# Patient Record
Sex: Female | Born: 1975 | Race: White | Hispanic: No | Marital: Married | Smoking: Never smoker
Health system: Southern US, Community
[De-identification: ages and names within clinical notes are randomized; demographics above are authoritative.]

## PROBLEM LIST (undated history)

## (undated) DIAGNOSIS — K219 Gastro-esophageal reflux disease without esophagitis: Secondary | ICD-10-CM

## (undated) DIAGNOSIS — N856 Intrauterine synechiae: Secondary | ICD-10-CM

## (undated) DIAGNOSIS — L309 Dermatitis, unspecified: Principal | ICD-10-CM

## (undated) DIAGNOSIS — E039 Hypothyroidism, unspecified: Secondary | ICD-10-CM

## (undated) HISTORY — DX: Dermatitis, unspecified: L30.9

## (undated) HISTORY — PX: DILATION AND EVACUATION: SHX1459

---

## 1999-06-09 ENCOUNTER — Encounter: Admission: RE | Admit: 1999-06-09 | Discharge: 1999-06-09 | Payer: Self-pay | Admitting: Sports Medicine

## 1999-06-10 ENCOUNTER — Encounter: Admission: RE | Admit: 1999-06-10 | Discharge: 1999-06-10 | Payer: Self-pay | Admitting: Family Medicine

## 1999-07-22 ENCOUNTER — Encounter: Admission: RE | Admit: 1999-07-22 | Discharge: 1999-07-22 | Payer: Self-pay | Admitting: Sports Medicine

## 2000-12-20 ENCOUNTER — Encounter: Admission: RE | Admit: 2000-12-20 | Discharge: 2000-12-20 | Payer: Self-pay | Admitting: Family Medicine

## 2001-01-03 ENCOUNTER — Encounter: Admission: RE | Admit: 2001-01-03 | Discharge: 2001-01-03 | Payer: Self-pay | Admitting: Family Medicine

## 2002-03-04 ENCOUNTER — Encounter: Admission: RE | Admit: 2002-03-04 | Discharge: 2002-03-04 | Payer: Self-pay | Admitting: Family Medicine

## 2002-08-27 ENCOUNTER — Encounter: Admission: RE | Admit: 2002-08-27 | Discharge: 2002-08-27 | Payer: Self-pay | Admitting: Family Medicine

## 2002-11-06 ENCOUNTER — Encounter: Admission: RE | Admit: 2002-11-06 | Discharge: 2002-11-06 | Payer: Self-pay | Admitting: Sports Medicine

## 2003-01-01 ENCOUNTER — Encounter: Admission: RE | Admit: 2003-01-01 | Discharge: 2003-01-01 | Payer: Self-pay | Admitting: Family Medicine

## 2003-02-26 ENCOUNTER — Encounter: Admission: RE | Admit: 2003-02-26 | Discharge: 2003-02-26 | Payer: Self-pay | Admitting: Family Medicine

## 2003-03-19 ENCOUNTER — Encounter: Admission: RE | Admit: 2003-03-19 | Discharge: 2003-03-19 | Payer: Self-pay | Admitting: Sports Medicine

## 2003-08-04 ENCOUNTER — Other Ambulatory Visit: Admission: RE | Admit: 2003-08-04 | Discharge: 2003-08-04 | Payer: Self-pay | Admitting: Obstetrics and Gynecology

## 2003-10-24 ENCOUNTER — Inpatient Hospital Stay (HOSPITAL_COMMUNITY): Admission: AD | Admit: 2003-10-24 | Discharge: 2003-10-24 | Payer: Self-pay | Admitting: Obstetrics and Gynecology

## 2003-11-20 ENCOUNTER — Ambulatory Visit (HOSPITAL_COMMUNITY): Admission: RE | Admit: 2003-11-20 | Discharge: 2003-11-20 | Payer: Self-pay | Admitting: Obstetrics & Gynecology

## 2003-11-20 ENCOUNTER — Encounter (INDEPENDENT_AMBULATORY_CARE_PROVIDER_SITE_OTHER): Payer: Self-pay | Admitting: Specialist

## 2005-05-02 ENCOUNTER — Ambulatory Visit: Payer: Self-pay | Admitting: Sports Medicine

## 2005-11-09 ENCOUNTER — Ambulatory Visit (HOSPITAL_COMMUNITY): Admission: RE | Admit: 2005-11-09 | Discharge: 2005-11-09 | Payer: Self-pay | Admitting: Obstetrics and Gynecology

## 2006-07-06 ENCOUNTER — Ambulatory Visit (HOSPITAL_COMMUNITY): Admission: RE | Admit: 2006-07-06 | Discharge: 2006-07-06 | Payer: Self-pay | Admitting: Obstetrics and Gynecology

## 2006-10-30 ENCOUNTER — Ambulatory Visit: Payer: Self-pay | Admitting: Sports Medicine

## 2006-10-31 ENCOUNTER — Encounter (INDEPENDENT_AMBULATORY_CARE_PROVIDER_SITE_OTHER): Payer: Self-pay | Admitting: *Deleted

## 2006-10-31 ENCOUNTER — Ambulatory Visit: Payer: Self-pay | Admitting: Family Medicine

## 2006-10-31 LAB — CONVERTED CEMR LAB
LDL Cholesterol: 114 mg/dL — ABNORMAL HIGH (ref 0–99)
Total CHOL/HDL Ratio: 3.3
VLDL: 15 mg/dL (ref 0–40)

## 2006-12-06 ENCOUNTER — Ambulatory Visit: Payer: Self-pay | Admitting: Sports Medicine

## 2007-05-30 ENCOUNTER — Ambulatory Visit: Payer: Self-pay | Admitting: Family Medicine

## 2007-05-30 DIAGNOSIS — J3089 Other allergic rhinitis: Secondary | ICD-10-CM

## 2007-08-05 ENCOUNTER — Ambulatory Visit: Payer: Self-pay | Admitting: Family Medicine

## 2007-08-05 ENCOUNTER — Telehealth: Payer: Self-pay | Admitting: *Deleted

## 2008-05-06 ENCOUNTER — Ambulatory Visit: Payer: Self-pay | Admitting: Family Medicine

## 2008-05-06 DIAGNOSIS — R030 Elevated blood-pressure reading, without diagnosis of hypertension: Secondary | ICD-10-CM | POA: Insufficient documentation

## 2008-05-08 ENCOUNTER — Ambulatory Visit: Payer: Self-pay | Admitting: Sports Medicine

## 2008-05-08 DIAGNOSIS — M25569 Pain in unspecified knee: Secondary | ICD-10-CM

## 2008-05-08 DIAGNOSIS — M21619 Bunion of unspecified foot: Secondary | ICD-10-CM

## 2009-02-22 ENCOUNTER — Ambulatory Visit: Payer: Self-pay | Admitting: Sports Medicine

## 2009-02-22 DIAGNOSIS — M79609 Pain in unspecified limb: Secondary | ICD-10-CM

## 2010-03-03 ENCOUNTER — Ambulatory Visit: Payer: Self-pay | Admitting: Sports Medicine

## 2010-03-03 DIAGNOSIS — R269 Unspecified abnormalities of gait and mobility: Secondary | ICD-10-CM

## 2010-04-13 ENCOUNTER — Ambulatory Visit: Payer: Self-pay | Admitting: Family Medicine

## 2010-07-26 NOTE — Assessment & Plan Note (Signed)
Summary: RT ANKLE INJURY,RUNNER,MC   Vital Signs:  Patient profile:   35 year old female BP sitting:   134 / 86  Vitals Entered By: Lillia Pauls CMA (March 03, 2010 9:23 AM)  History of Present Illness: Patient w increasing RT foot and arch pain has had a large bunion for some years has done reasonably well w orthotics to control sxs able to run for several years  seen a few mos ago and she has some orthotics adjustment some temp benefit w that now pain is in mid arch and along medial aspect of RT foot  Allergies: 1)  Penicillin  Physical Exam  General:  Well-developed,well-nourished,in no acute distress; alert,appropriate and cooperative throughout examination Msk:  pronated RT foot with partial correction w orthotics large bunion on RT no real TTP of arch or of PF insertion no TTP over tarsal bones  running gait is corrected well in orthtoics   Impression & Recommendations:  Problem # 1:  BUNIONS, BILATERAL (ICD-727.1) Now w increasing pain over RT foot suspect this is combination of bunion not giveing good support chronic pronation stressing arch  given arch strap given scaphoid pad under orthotic  this did seem to help  will ease back into running If not enough try new orthotics with more supination  some point may need surgery on RT  Problem # 2:  ABNORMALITY OF GAIT (ICD-781.2) orthtoics clearly help  will cont in these and replace if the repair is not enough  Complete Medication List: 1)  Azithromycin 250 Mg Tabs (Azithromycin) .... 2 tablets by mouth on day 1 then 1 tablet a day x 4 days. 2)  Veramyst 27.5 Mcg/spray Susp (Fluticasone furoate) .Marland Kitchen.. 1 spray into each nostril a day. may increase to twice a day if needed.  disp 1 bottle. 3)  Voltaren 1 % Gel (Diclofenac sodium) .... Use 2 grams four times a day

## 2010-07-26 NOTE — Assessment & Plan Note (Signed)
Summary: CPE,TCB   Vital Signs:  Patient profile:   35 year old female Weight:      167 pounds BMI:     26.25 Temp:     99.1 degrees F oral Pulse rate:   53 / minute Pulse rhythm:   regular BP sitting:   132 / 93  (left arm) Cuff size:   regular  Vitals Entered By: Loralee Pacas CMA (April 13, 2010 2:31 PM) CC: cpe   CC:  cpe.  History of Present Illness: Healthy - gets Paps elsewhere.  No active complaints. Had borderline high chol in remote past.  Well controled with diet.  Will check q 5 y Does not want flu shots.  Sees herbalist and does not feel need despite my suggestion  Habits & Providers  Alcohol-Tobacco-Diet     Alcohol drinks/day: <1     Tobacco Status: never     Diet Comments: healthy  Exercise-Depression-Behavior     Does Patient Exercise: yes     Exercise Counseling: not indicated; exercise is adequate     Type of exercise: run aerobic     Exercise (avg: min/session): 30-60     Times/week: 4     Have you felt down or hopeless? no     Have you felt little pleasure in things? no     Depression Counseling: not indicated; screening negative for depression     STD Risk: never     Drug Use: never     Seat Belt Use: always     Sun Exposure: infrequent  Current Medications (verified): 1)  None  Allergies (verified): 1)  Penicillin  Past History:  Past Medical History: Last updated: 05/18/08 broken 5th metatarsal right  Multiple miscarriages  Past Surgical History: Last updated: 08/23/2006 dislocation left 5th finger - 04/26/1996  Family History: Last updated: 2008-05-18 father obese died of MI age 37, mgm with lupus and dm/ mgf died prostate cancer?, mother with lupus, pgm 80/ pgf died mi age 56  Social History: Last updated: 05-18-2008 woeks as admissions counselor at [employer]; lives with david Rasmussen, who is a fantastic husband; no excesssive etoh/ non smoker Regualr exercise, running Diet, healthy with low sodium  Risk  Factors: Alcohol Use: <1 (04/13/2010) Diet: healthy (04/13/2010) Exercise: yes (04/13/2010)  Risk Factors: Smoking Status: never (04/13/2010)  Social History: Does Patient Exercise:  yes STD Risk:  never Drug Use:  never Risk analyst Use:  always Sun Exposure-Excessive:  infrequent  Review of Systems  The patient denies fever, chest pain, syncope, dyspnea on exertion, peripheral edema, prolonged cough, headaches, abdominal pain, melena, severe indigestion/heartburn, depression, unusual weight change, abnormal bleeding, and enlarged lymph nodes.    Physical Exam  General:  Well-developed,well-nourished,in no acute distress; alert,appropriate and cooperative throughout examination Head:  Normocephalic and atraumatic without obvious abnormalities. No apparent alopecia or balding. Mouth:  Oral mucosa and oropharynx without lesions or exudates.  Teeth in good repair. Neck:  No deformities, masses, or tenderness noted. Lungs:  Normal respiratory effort, chest expands symmetrically. Lungs are clear to auscultation, no crackles or wheezes. Heart:  Normal rate and regular rhythm. S1 and S2 normal without gallop, murmur, click, rub or other extra sounds. Abdomen:  Bowel sounds positive,abdomen soft and non-tender without masses, organomegaly or hernias noted. Extremities:  No clubbing, cyanosis, edema, or deformity noted with normal full range of motion of all joints.   Neurologic:  No cranial nerve deficits noted. Station and gait are normal. Plantar reflexes are down-going bilaterally. DTRs  are symmetrical throughout. Sensory, motor and coordinative functions appear intact.   Impression & Recommendations:  Problem # 1:  Preventive Health Care (ICD-V70.0)  Very healthy.  Borderline high BP She will keep eye on it.  Cont good diet and exercise.  Orders: [facility] - Est  18-39 yrs (16109)   Orders Added: 1)  [facility] - Est  18-39 yrs [99395]    Prevention & Chronic Care Immunizations    Influenza vaccine: Not documented   Influenza vaccine deferral: Refused  (04/13/2010)    Tetanus booster: 05/06/2008: Tdap    Pneumococcal vaccine: Not documented  Other Screening   Pap smear: Not documented   Smoking status: never  (04/13/2010)  Lipids   Total Cholesterol: 184  (10/31/2006)   LDL: 114  (10/31/2006)   LDL Direct: Not documented   HDL: 55  (10/31/2006)   Triglycerides: 76  (10/31/2006)   Prevention & Chronic Care Immunizations   Influenza vaccine: Not documented   Influenza vaccine deferral: Refused  (04/13/2010)    Tetanus booster: 05/06/2008: Tdap    Pneumococcal vaccine: Not documented  Other Screening   Pap smear: Not documented   Smoking status: never  (04/13/2010)  Lipids   Total Cholesterol: 184  (10/31/2006)   LDL: 114  (10/31/2006)   LDL Direct: Not documented   HDL: 55  (10/31/2006)   Triglycerides: 76  (10/31/2006)

## 2010-11-11 NOTE — Op Note (Signed)
Vanessa Durham, Vanessa Durham               ACCOUNT NO.:  [id number]   MEDICAL RECORD NO.:  [id number]          PATIENT TYPE:  AMB   LOCATION:  [facility]                           FACILITY:  [facility]   PHYSICIAN:  Maxie Better, M.D.DATE OF BIRTH:  03-29-1976   DATE OF PROCEDURE:  07/06/2006  DATE OF DISCHARGE:                               OPERATIVE REPORT   PREOPERATIVE DIAGNOSIS:  Intrauterine fetal demise at 14 weeks,  recurrent pregnancy loss.   POSTOPERATIVE DIAGNOSIS:  Intrauterine fetal demise at 14 weeks,  recurrent pregnancy loss.   PROCEDURE:  Ultrasound guided second trimester suction dilation and  evacuation.   ANESTHESIA:  General.   SURGEON:  Maxie Better, M.D.   DESCRIPTION OF PROCEDURE:  Under adequate general anesthesia, the  patient was placed in the dorsal lithotomy position.  She was sterilely  prepped and draped in the usual fashion.  The bladder was catheterized  for a small amount of urine.  The patient had just voided prior to going  into the operating room.  Examination under anesthesia revealed an  anteverted uterus, 13-14 weeks size, no adnexal masses could be  appreciated.  A bivalve speculum was placed in the vagina.  The  laminaria that had been in place had been removed in the process of  preparing the patient.  A single tooth tenaculum was placed on the  anterior lip of the cervix.  The cervix was then serially dilated up to  a #43 Pratt dilator.  A #14 mm curved suction cannula was introduced  into the uterine cavity and under ultrasound guidance, the pregnancy was  removed with suction, subsequently curetting and suction.  When all  tissue was noted to be removed by ultrasound showing a thin strip, the  instruments were removed from the vagina.  The specimen was inspected,  tissue removed from the thigh of the fetus as well as placental tissue  sent for chromosomal analysis.  The rest of the specimen was sent to  pathology labeled products of  conception.  Estimated blood loss was  about 200 mL.  Complications were none.  Maternal blood type Rh  positive.  The patient tolerated the procedure well and was transferred  to the recovery room in stable condition.      Maxie Better, M.D.  Electronically Signed     [place]/MEDQ  D:  07/06/2006  T:  07/06/2006  Job:  [id number]

## 2010-11-11 NOTE — Op Note (Signed)
NAME:  Vanessa Durham, Vanessa Durham                         ACCOUNT NO.:  [id number]   MEDICAL RECORD NO.:  [id number]                   PATIENT TYPE:  AMB   LOCATION:  SDC                                  FACILITY:  [facility]   PHYSICIAN:  Genia Del, M.D.             DATE OF BIRTH:  [birth year]   DATE OF PROCEDURE:  11/20/2003  DATE OF DISCHARGE:                                 OPERATIVE REPORT   PREOPERATIVE DIAGNOSIS:  Twenty-three plus week gestation with severe  intrauterine growth restriction, less than the third percentile, and fetal  demise.   POSTOPERATIVE DIAGNOSIS:  Twenty-three plus week gestation with severe  intrauterine growth restriction, less than the third percentile, and fetal  demise.   INTERVENTION:  Dilatation and evacuation under ultrasound guidance.   SURGEON:  Genia Del, M.D.   ANESTHESIOLOGIST:  Octaviano Glow. Pamalee Leyden, M.D.   PROCEDURE:  Under general anesthesia with endotracheal intubation, the  patient is in lithotomy position.  She is prepped with Hibiclens on the  suprapubic, vulvar, and vaginal areas.  Two sponges and one large laminaria  are removed.  They were put in place on Nov 19, 2003, at the office.  We  complete Hibiclens prep of the vagina.  The vaginal exam reveals an  anteverted uterus about 20 cm, mobile, no adnexal masses.  The cervix is  open post laminaria, no vaginal bleeding.  The speculum is inserted in the  vagina.  We grasp the anterior lip of the cervix with a tenaculum.  Dilatation is easily done with Hegar dilators up to #59 with minimal  resistance.  We then use a #16 suction curette to evacuate the fluid.  We  then proceed with evacuation of the fetal parts with a large fenestrated  clamp.  Fetal limbs are counted.  We evacuate the head under ultrasound  guidance and continue with ultrasound guidance to assure complete evacuation  of the intrauterine cavity.  We complete evacuation of the placenta with the  suction curette and then  the sharp curette.  The uterine sound is heard on  all surfaces.  By ultrasound, no evidence of remaining fetal parts or  products of conception are seen.  We feel that the uterus is contracting  well.  Hemostasis is adequate.  We remove all instruments.  A vaginal exam  is done.  The uterus is retroverted, decreased in volume, and firm.  The  estimated blood loss was 100 mL.  No complication occurred, and the patient  was transferred to recovery in stable status.  Note that her blood group is  Rh positive.  She received a dose of Ancef 1 g IV at induction, and  chromosomal studies will be done on the products of conception.  The rest  will be sent to pathology.  Genia Del, M.D.    ML/MEDQ  D:  11/20/2003  T:  11/21/2003  Job:  [id number]

## 2011-03-20 ENCOUNTER — Ambulatory Visit (INDEPENDENT_AMBULATORY_CARE_PROVIDER_SITE_OTHER): Payer: 59 | Admitting: Sports Medicine

## 2011-03-20 VITALS — BP 140/84

## 2011-03-20 DIAGNOSIS — M25559 Pain in unspecified hip: Secondary | ICD-10-CM

## 2011-03-20 DIAGNOSIS — M25551 Pain in right hip: Secondary | ICD-10-CM

## 2011-03-20 NOTE — Assessment & Plan Note (Signed)
Patient has no true signs of any stress fracture on physical exam or on ultrasound. At this time the clinical exam as well as the ultrasound shows likely a pectineus muscle versus tear. Patient getting hip exercises to do at this time and to avoid any weight lifting for the next 4 weeks Patient can do some cycling swimming as well as elliptical if necessary. Patient at this point declined the nitroglycerin patches but if not better in 4 weeks she will attempt at that time Patient will come back in 4 weeks for reevaluation

## 2011-03-20 NOTE — Progress Notes (Signed)
Patient is coming in with right hip/groin pain. Patient states that this started in about June when patient was starting to do a boot camp class as well as increasing her right and. He should states that the pain seemed to be more severe whenever she did squats or any type of flexion of the hip. Patient states that the pain used to radiate down the lateral side of the hip down to the knee level. Patient denies any type of weakness or numbness does state that she has some tingling in her foot in the morning from time to time but does not know if this is associated. Patient does not remember any type of injury. Patient for the last 5 weeks has stopped her regular physical activities to see if this pain would get better but unfortunately has not. Patient continues to teach a running school but can only run for about 9-10 minutes and then has pain. Patient also states that when she moves certain ways the pain seems to be worse especially if she externally rotates her hip. Patient has tried ibuprofen which does help somewhat patient has also seen a rolfer which is helps some too. Patient though would like evaluation before she increases her physical activity again.  ROS: Negative as related to the chief complaint.  Physical exam: Vitals reviewed Hip: ROM  right hip: IR: 20 Deg, ER: 60 Deg, Flexion: 120 Deg, Extension: 80 Deg, Abduction: 45 Deg, Adduction: 35 Deg Left hip normal ROM Strength IR: 5/5, ER: 5/5, Flexion: 5/5, Extension: 5/5, Abduction: 5/5, Adduction: 5/5 Pelvic alignment unremarkable to inspection and palpation. Standing hip rotation and gait without trendelenburg / unsteadiness. Greater trochanter without tenderness to palpation. No tenderness over piriformis and greater trochanter. No SI joint tenderness and normal minimal SI movement.  No low back pain   Muscle skeletal ultrasound: On exam patient is mildly in transverse and longitudinal view does not show any type of periosteal  inflammation or reaction no stress fracture noted. Patient though on the insertion of the pectineus muscle shows some mild edema as well as neovascularization surrounding the fascia. No true tear appreciated.

## 2011-05-01 ENCOUNTER — Ambulatory Visit: Payer: 59 | Admitting: Sports Medicine

## 2011-05-23 ENCOUNTER — Ambulatory Visit
Admission: RE | Admit: 2011-05-23 | Discharge: 2011-05-23 | Disposition: A | Discharge status: Home / Self Care | Payer: 59 | Source: Ambulatory Visit | Attending: Sports Medicine | Admitting: Sports Medicine

## 2011-05-23 ENCOUNTER — Ambulatory Visit (INDEPENDENT_AMBULATORY_CARE_PROVIDER_SITE_OTHER): Payer: 59 | Admitting: Sports Medicine

## 2011-05-23 VITALS — BP 119/81

## 2011-05-23 DIAGNOSIS — M25551 Pain in right hip: Secondary | ICD-10-CM

## 2011-05-23 DIAGNOSIS — M25559 Pain in unspecified hip: Secondary | ICD-10-CM

## 2011-05-23 NOTE — Assessment & Plan Note (Addendum)
Suspect adductor strain however we feel at this point x-rays are warranted. Plan to get AP of pelvis and followup with plan. If no fracture or significant arthritis present will proceed with continued rehabilitation instructions.  She wants to wait on XRay as she is trying for pregnancy  Will consider formal PT once Xrays complete

## 2011-05-23 NOTE — Progress Notes (Signed)
Vanessa Durham presents to clinic today to followup her right hip pain. In the interval she doesn't feel like she is much improved. She hasn't been doing much activity which is frustrating to her. She is occasionally going to spin class which does not hurt her. She does note occasional pain in her right hip with some rotation.  She feels well otherwise. She would like to run a half marathon this summer.  No pain with abdominal exercises or with valsalva No pain above inguinal ligament  PMH reviewed.  ROS as above otherwise neg Medications reviewed. No current outpatient prescriptions on file.    Exam:  BP 119/81 Gen: Well NAD MSK: Right hip: Some pain on range of motion it limits with flexion and internal worse greater than external range of motion. Range of motion is not however limited. Negative hop test on right. Hip strength is normal in all mortalities except for right hip adduction. Gait is normal  No hernias or pain the pubic symphysis noted

## 2011-05-23 NOTE — Patient Instructions (Signed)
We will call you about your X-ray.  We will likely be doing some PT as long as your xrays are pretty normal.  Expect a call.

## 2011-06-07 ENCOUNTER — Ambulatory Visit
Admission: RE | Admit: 2011-06-07 | Discharge: 2011-06-07 | Disposition: A | Discharge status: Home / Self Care | Payer: 59 | Source: Ambulatory Visit | Attending: Sports Medicine | Admitting: Sports Medicine

## 2011-06-07 ENCOUNTER — Telehealth: Payer: Self-pay | Admitting: *Deleted

## 2011-06-07 DIAGNOSIS — M25559 Pain in unspecified hip: Secondary | ICD-10-CM

## 2011-06-07 NOTE — Telephone Encounter (Signed)
Message copied by Mora Bellman on Wed Jun 07, 2011  2:07 PM ------      Message from: Enid Baas      Created: Wed Jun 07, 2011 10:45 AM       Please let her know that the XRays are normal - which is good.  We need to push the rehab again and see if this will improve the hip  Motion and pain      ----- Message -----         From: Rad Results In Interface         Sent: 06/07/2011   8:58 AM           To: Enid Baas, MD

## 2011-06-07 NOTE — Telephone Encounter (Signed)
Called pt left VM to return my call. 

## 2011-06-07 NOTE — Telephone Encounter (Signed)
Spoke with pt- advised that x-rays are normal.  She requests PT referral to [facility].  PT referral info sent today.

## 2012-05-18 ENCOUNTER — Encounter (HOSPITAL_COMMUNITY): Payer: Self-pay | Admitting: Emergency Medicine

## 2012-05-18 ENCOUNTER — Emergency Department (HOSPITAL_COMMUNITY)
Admission: EM | Admit: 2012-05-18 | Discharge: 2012-05-18 | Disposition: A | Discharge status: Home / Self Care | Payer: 59 | Source: Home / Self Care | Attending: Family Medicine | Admitting: Family Medicine

## 2012-05-18 DIAGNOSIS — J069 Acute upper respiratory infection, unspecified: Secondary | ICD-10-CM

## 2012-05-18 MED ORDER — HYDROCOD POLST-CHLORPHEN POLST 10-8 MG/5ML PO LQCR: 5.0000 mL | Freq: Two times a day (BID) | ORAL | Status: DC

## 2012-05-18 NOTE — ED Provider Notes (Signed)
History     CSN: [id number]  Arrival date & time 05/18/12  7829   First MD Initiated Contact with Patient 05/18/12 1825      No chief complaint on file.   (Consider location/radiation/quality/duration/timing/severity/associated sxs/prior treatment) Patient is a 36 y.o. female presenting with cough. The history is provided by the patient.  Cough This is a new problem. The current episode started more than 2 days ago. The problem has not changed since onset.The cough is non-productive. There has been no fever. Associated symptoms include rhinorrhea. Pertinent negatives include no chest pain, no chills, no sore throat, no myalgias, no shortness of breath and no wheezing.    No past medical history on file.  No past surgical history on file.  No family history on file.  History  Substance Use Topics  . Smoking status: Not on file  . Smokeless tobacco: Not on file  . Alcohol Use: Not on file    OB History    No data available      Review of Systems  Constitutional: Negative for fever and chills.  HENT: Positive for congestion and rhinorrhea. Negative for sore throat.   Respiratory: Positive for cough. Negative for shortness of breath and wheezing.   Cardiovascular: Negative for chest pain.  Gastrointestinal: Negative.   Musculoskeletal: Negative for myalgias.    Allergies  Penicillins  Home Medications   Current Outpatient Rx  Name  Route  Sig  Dispense  Refill  . HYDROCOD POLST-CPM POLST ER 10-8 MG/5ML PO LQCR   Oral   Take 5 mLs by mouth every 12 (twelve) hours.   115 mL   0     BP 91/55  Pulse 78  Temp 100.1 F (37.8 C) (Oral)  Resp 18  SpO2 96%  Physical Exam  Nursing note and vitals reviewed. Constitutional: She is oriented to person, place, and time. She appears well-developed and well-nourished.  HENT:  Head: Normocephalic.  Right Ear: External ear normal.  Left Ear: External ear normal.  Mouth/Throat: Oropharynx is clear and moist.  Eyes:  Conjunctivae normal are normal. Pupils are equal, round, and reactive to light.  Neck: Normal range of motion. Neck supple.  Cardiovascular: Normal rate, regular rhythm, normal heart sounds and intact distal pulses.   Pulmonary/Chest: Effort normal and breath sounds normal.  Lymphadenopathy:    She has no cervical adenopathy.  Neurological: She is alert and oriented to person, place, and time.  Skin: Skin is warm and dry.  Psychiatric: She has a normal mood and affect.    ED Course  Procedures (including critical care time)  Labs Reviewed - No data to display No results found.   1. URI (upper respiratory infection)       MDM          Linna Hoff, MD 05/18/12 [phone]

## 2012-05-18 NOTE — ED Notes (Signed)
Pt c/o cold sx x4 days... Sx include: cough w/clear sputum, chest congestion, sore throat, fevers, headaches, loss of appetite... Denies: vomiting, nauseas, diarrhea... Pt is alert w/no signs of distress.

## 2013-04-24 ENCOUNTER — Other Ambulatory Visit (HOSPITAL_COMMUNITY): Payer: Self-pay | Admitting: Obstetrics and Gynecology

## 2013-04-24 DIAGNOSIS — Z3141 Encounter for fertility testing: Secondary | ICD-10-CM

## 2013-04-30 ENCOUNTER — Ambulatory Visit (HOSPITAL_COMMUNITY)
Admission: RE | Admit: 2013-04-30 | Discharge: 2013-04-30 | Disposition: A | Discharge status: Home / Self Care | Payer: BC Managed Care – PPO | Source: Ambulatory Visit | Attending: Obstetrics and Gynecology | Admitting: Obstetrics and Gynecology

## 2013-04-30 DIAGNOSIS — Z3141 Encounter for fertility testing: Secondary | ICD-10-CM

## 2013-04-30 DIAGNOSIS — N96 Recurrent pregnancy loss: Secondary | ICD-10-CM | POA: Insufficient documentation

## 2013-04-30 MED ORDER — IOHEXOL 300 MG/ML  SOLN
20.0000 mL | Freq: Once | INTRAMUSCULAR | Status: AC | PRN
  Administered 2013-04-30: 9 mL

## 2013-07-24 ENCOUNTER — Encounter (HOSPITAL_BASED_OUTPATIENT_CLINIC_OR_DEPARTMENT_OTHER): Payer: Self-pay | Admitting: *Deleted

## 2013-07-24 NOTE — Progress Notes (Signed)
Npo after mn. Arrive at 1030. Needs hg and urine preg. Will take synthroid am dos w/ sips of water.

## 2013-07-30 ENCOUNTER — Ambulatory Visit (HOSPITAL_BASED_OUTPATIENT_CLINIC_OR_DEPARTMENT_OTHER): Payer: BC Managed Care – PPO | Admitting: Anesthesiology

## 2013-07-30 ENCOUNTER — Encounter (HOSPITAL_BASED_OUTPATIENT_CLINIC_OR_DEPARTMENT_OTHER)
Admission: RE | Disposition: A | Discharge status: Home / Self Care | Payer: Self-pay | Source: Ambulatory Visit | Attending: Obstetrics and Gynecology

## 2013-07-30 ENCOUNTER — Encounter (HOSPITAL_BASED_OUTPATIENT_CLINIC_OR_DEPARTMENT_OTHER): Payer: Self-pay

## 2013-07-30 ENCOUNTER — Encounter (HOSPITAL_BASED_OUTPATIENT_CLINIC_OR_DEPARTMENT_OTHER): Payer: BC Managed Care – PPO | Admitting: Anesthesiology

## 2013-07-30 ENCOUNTER — Ambulatory Visit (HOSPITAL_BASED_OUTPATIENT_CLINIC_OR_DEPARTMENT_OTHER)
Admission: RE | Admit: 2013-07-30 | Discharge: 2013-07-30 | Disposition: A | Discharge status: Home / Self Care | Payer: BC Managed Care – PPO | Source: Ambulatory Visit | Attending: Obstetrics and Gynecology | Admitting: Obstetrics and Gynecology

## 2013-07-30 DIAGNOSIS — N856 Intrauterine synechiae: Secondary | ICD-10-CM | POA: Insufficient documentation

## 2013-07-30 DIAGNOSIS — K219 Gastro-esophageal reflux disease without esophagitis: Secondary | ICD-10-CM | POA: Insufficient documentation

## 2013-07-30 DIAGNOSIS — N96 Recurrent pregnancy loss: Secondary | ICD-10-CM | POA: Insufficient documentation

## 2013-07-30 DIAGNOSIS — N949 Unspecified condition associated with female genital organs and menstrual cycle: Secondary | ICD-10-CM | POA: Insufficient documentation

## 2013-07-30 DIAGNOSIS — N938 Other specified abnormal uterine and vaginal bleeding: Secondary | ICD-10-CM | POA: Insufficient documentation

## 2013-07-30 DIAGNOSIS — E039 Hypothyroidism, unspecified: Secondary | ICD-10-CM | POA: Insufficient documentation

## 2013-07-30 HISTORY — DX: Intrauterine synechiae: N85.6

## 2013-07-30 HISTORY — PX: HYSTEROSCOPY: SHX211

## 2013-07-30 HISTORY — DX: Gastro-esophageal reflux disease without esophagitis: K21.9

## 2013-07-30 HISTORY — DX: Hypothyroidism, unspecified: E03.9

## 2013-07-30 LAB — POCT HEMOGLOBIN-HEMACUE: Hemoglobin: 14.6 g/dL (ref 12.0–15.0)

## 2013-07-30 LAB — POCT PREGNANCY, URINE: PREG TEST UR: NEGATIVE

## 2013-07-30 SURGERY — HYSTEROSCOPY
Anesthesia: General | Site: Uterus

## 2013-07-30 MED ORDER — LACTATED RINGERS IV SOLN
INTRAVENOUS | Status: DC
  Administered 2013-07-30: 11:00:00 via INTRAVENOUS
  Filled 2013-07-30: qty 1000

## 2013-07-30 MED ORDER — SODIUM CHLORIDE 0.9 % IR SOLN
Status: DC | PRN
  Administered 2013-07-30: 3000 mL

## 2013-07-30 MED ORDER — ONDANSETRON HCL 4 MG/2ML IJ SOLN
INTRAMUSCULAR | Status: DC | PRN
  Administered 2013-07-30: 4 mg via INTRAVENOUS

## 2013-07-30 MED ORDER — FENTANYL CITRATE 0.05 MG/ML IJ SOLN
INTRAMUSCULAR | Status: DC | PRN
  Administered 2013-07-30: 25 ug via INTRAVENOUS
  Administered 2013-07-30: 50 ug via INTRAVENOUS
  Administered 2013-07-30 (×6): 25 ug via INTRAVENOUS

## 2013-07-30 MED ORDER — DEXAMETHASONE SODIUM PHOSPHATE 4 MG/ML IJ SOLN
INTRAMUSCULAR | Status: DC | PRN
  Administered 2013-07-30: 10 mg via INTRAVENOUS

## 2013-07-30 MED ORDER — MIDAZOLAM HCL 2 MG/2ML IJ SOLN
INTRAMUSCULAR | Status: AC
  Filled 2013-07-30: qty 2

## 2013-07-30 MED ORDER — PROMETHAZINE HCL 25 MG/ML IJ SOLN
6.2500 mg | INTRAMUSCULAR | Status: DC | PRN
  Filled 2013-07-30: qty 1

## 2013-07-30 MED ORDER — LACTATED RINGERS IV SOLN
INTRAVENOUS | Status: DC | PRN
  Administered 2013-07-30 (×2): via INTRAVENOUS

## 2013-07-30 MED ORDER — GENTAMICIN SULFATE 40 MG/ML IJ SOLN
INTRAVENOUS | Status: AC
  Administered 2013-07-30: 12:00:00 via INTRAVENOUS
  Filled 2013-07-30: qty 10.25

## 2013-07-30 MED ORDER — MIDAZOLAM HCL 5 MG/5ML IJ SOLN
INTRAMUSCULAR | Status: DC | PRN
  Administered 2013-07-30 (×2): 1 mg via INTRAVENOUS

## 2013-07-30 MED ORDER — VASOPRESSIN 20 UNIT/ML IJ SOLN
INTRAVENOUS | Status: DC | PRN
  Administered 2013-07-30: 13:00:00 via INTRAMUSCULAR

## 2013-07-30 MED ORDER — KETOROLAC TROMETHAMINE 30 MG/ML IJ SOLN
INTRAMUSCULAR | Status: DC | PRN
  Administered 2013-07-30: 30 mg via INTRAVENOUS

## 2013-07-30 MED ORDER — DOXYCYCLINE HYCLATE 50 MG PO CAPS: 100.0000 mg | ORAL_CAPSULE | Freq: Two times a day (BID) | ORAL | Status: AC

## 2013-07-30 MED ORDER — PROPOFOL 10 MG/ML IV BOLUS
INTRAVENOUS | Status: DC | PRN
  Administered 2013-07-30: 180 mg via INTRAVENOUS

## 2013-07-30 MED ORDER — HYDROMORPHONE HCL PF 1 MG/ML IJ SOLN
0.2500 mg | INTRAMUSCULAR | Status: DC | PRN
  Filled 2013-07-30: qty 1

## 2013-07-30 MED ORDER — STERILE WATER FOR IRRIGATION IR SOLN
Status: DC | PRN
  Administered 2013-07-30: 500 mL

## 2013-07-30 MED ORDER — FENTANYL CITRATE 0.05 MG/ML IJ SOLN
INTRAMUSCULAR | Status: AC
  Filled 2013-07-30: qty 4

## 2013-07-30 MED ORDER — LIDOCAINE HCL (CARDIAC) 20 MG/ML IV SOLN
INTRAVENOUS | Status: DC | PRN
  Administered 2013-07-30: 60 mg via INTRAVENOUS

## 2013-07-30 MED ORDER — GLYCOPYRROLATE 0.2 MG/ML IJ SOLN
INTRAMUSCULAR | Status: DC | PRN
  Administered 2013-07-30: 0.2 mg via INTRAVENOUS

## 2013-07-30 MED ORDER — LACTATED RINGERS IV SOLN
INTRAVENOUS | Status: DC
  Filled 2013-07-30: qty 1000

## 2013-07-30 SURGICAL SUPPLY — 43 items
CANISTER SUCTION 2500CC (MISCELLANEOUS) ×3 IMPLANT
CANNULA CURETTE W/SYR 6 (CANNULA) IMPLANT
CANNULA CURETTE W/SYR 6MM (CANNULA)
CATH ROBINSON RED A/P 16FR (CATHETERS) ×3 IMPLANT
CORD ACTIVE DISPOSABLE (ELECTRODE)
CORD ELECTRO ACTIVE DISP (ELECTRODE) IMPLANT
COVER TABLE BACK 60X90 (DRAPES) ×3 IMPLANT
DRAPE CAMERA CLOSED 9X96 (DRAPES) ×3 IMPLANT
DRAPE LG THREE QUARTER DISP (DRAPES) ×6 IMPLANT
DRESSING TELFA 8X3 (GAUZE/BANDAGES/DRESSINGS) ×3 IMPLANT
ELECT LOOP GYNE PRO 24FR (CUTTING LOOP)
ELECT REM PT RETURN 9FT ADLT (ELECTROSURGICAL)
ELECT VAPORTRODE GRVD BAR (ELECTRODE) IMPLANT
ELECTRODE LOOP GYNE PRO 24FR (CUTTING LOOP) IMPLANT
ELECTRODE REM PT RTRN 9FT ADLT (ELECTROSURGICAL) IMPLANT
ELECTRODE ROLLER VERSAPOINT (ELECTRODE) IMPLANT
ELECTRODE RT ANGLE VERSAPOINT (CUTTING LOOP) IMPLANT
GLOVE BIO SURGEON STRL SZ8 (GLOVE) ×3 IMPLANT
GLOVE BIOGEL PI IND STRL 8.5 (GLOVE) ×1 IMPLANT
GLOVE BIOGEL PI INDICATOR 8.5 (GLOVE) ×2
GOWN STRL REIN XL XLG (GOWN DISPOSABLE) ×6 IMPLANT
GOWN STRL REUS W/ TWL LRG LVL3 (GOWN DISPOSABLE) IMPLANT
GOWN STRL REUS W/ TWL XL LVL3 (GOWN DISPOSABLE) IMPLANT
GOWN STRL REUS W/TWL LRG LVL3 (GOWN DISPOSABLE) ×3
GOWN STRL REUS W/TWL XL LVL3 (GOWN DISPOSABLE) ×3
LEGGING LITHOTOMY PAIR STRL (DRAPES) ×3 IMPLANT
LOOP ANGLED CUTTING 22FR (CUTTING LOOP) IMPLANT
NDL SPNL 22GX3.5 QUINCKE BK (NEEDLE) ×1 IMPLANT
NEEDLE SPNL 22GX3.5 QUINCKE BK (NEEDLE) ×3 IMPLANT
PACK BASIN DAY SURGERY FS (CUSTOM PROCEDURE TRAY) ×3 IMPLANT
PAD OB MATERNITY 4.3X12.25 (PERSONAL CARE ITEMS) ×3 IMPLANT
SET IRRIG Y TYPE TUR BLADDER L (SET/KITS/TRAYS/PACK) IMPLANT
STENT BALLN UTERINE 3CM 6FR (Stent) ×2 IMPLANT
STENT BALLN UTERINE 4CM 6FR (STENTS) IMPLANT
SUT SILK 2 0 SH (SUTURE) IMPLANT
SUT SILK 3 0 PS 1 (SUTURE) IMPLANT
SYR 20CC LL (SYRINGE) IMPLANT
SYR 3ML 18GX1 1/2 (SYRINGE) ×3 IMPLANT
SYR CONTROL 10ML LL (SYRINGE) ×3 IMPLANT
TOWEL OR 17X24 6PK STRL BLUE (TOWEL DISPOSABLE) ×6 IMPLANT
TRAY DSU PREP LF (CUSTOM PROCEDURE TRAY) ×3 IMPLANT
TUBING HYDROFLEX HYSTEROSCOPY (TUBING) ×3 IMPLANT
WATER STERILE IRR 500ML POUR (IV SOLUTION) ×3 IMPLANT

## 2013-07-30 NOTE — Discharge Instructions (Signed)
° °  [facility] Home care Instructions: ° ° °Personal hygiene:  Used sanitary napkins for vaginal drainage not tampons. Shower or tub bathe the day after your procedure. No douching until bleeding stops. Always wipe from front to back after  Elimination. ° °Activity: Do not drive or operate any equipment today. The effects of the anesthesia are still present and drowsiness may result. Rest today, not necessarily flat bed rest, just take it easy. You may resume your normal activity in one to 2 days. ° °Sexual activity: No intercourse for one week or as indicated by your physician ° °Diet: Eat a light diet as desired this evening. You may resume a regular diet tomorrow. ° °Return to work: One to 2 days. ° °General Expectations of your surgery: Vaginal bleeding should be no heavier than a normal period. Spotting may continue up to 10 days. Mild cramps may continue for a couple of days. You may have a regular period in 2-6 weeks. ° °Unexpected observations call your doctor if these occur: persistent or heavy bleeding. Severe abdominal cramping or pain. Elevation of temperature greater than 100°F. ° °Call for an appointment in one week. ° ° ° °Patient's Signature_______________________________________________________ ° °Nurse's Signature________________________________________________________ ° ° °Post Anesthesia Home Care Instructions ° °Activity: °Get plenty of rest for the remainder of the day. A responsible adult should stay with you for 24 hours following the procedure.  °For the next 24 hours, DO NOT: °-Drive a car °-Operate machinery °-Drink alcoholic beverages °-Take any medication unless instructed by your physician °-Make any legal decisions or sign important papers. ° °Meals: °Start with liquid foods such as gelatin or soup. Progress to regular foods as tolerated. Avoid greasy, spicy, heavy foods. If nausea and/or vomiting occur, drink only clear liquids until the nausea and/or vomiting subsides. Call your physician  if vomiting continues. ° °Special Instructions/Symptoms: °Your throat may feel dry or sore from the anesthesia or the breathing tube placed in your throat during surgery. If this causes discomfort, gargle with warm salt water. The discomfort should disappear within 24 hours. ° °

## 2013-07-30 NOTE — Op Note (Signed)
OPERATIVE NOTE  Preoperative diagnosis: Intrauterine adhesions  Postoperative diagnosis: Intrauterine Procedure: Hysteroscopy, Lysis of adhesions, D&C, intrauterine stent placement  Surgeon: Fermin SchwabYALCINKAYA,Trevia Nop  Anesthesia: General  Complications: None  Estimated blood loss: Less than 20 mL  Specimen: Endometrial curettings to pathology  Findings: Endocervical canal appeared normal. Endometrial cavity contained symmetric dense marginal adhesions along the lower uterine segment, such that the uterine cavity had taken on a T-shape, consistent with the previous hysterosalpingogram imaging.  After lysis of adhesions, normal uterine anatomy was obtained. Both tubal ostia were seen. The uterus sounded to 8 cm.  Description of procedure: Patient was placed in dorsal supine position. General anesthesia was administered. She was placed in lithotomy position. She was prepped and draped in sterile manner. A vaginal speculum was placed. A dilute vasopressin solution containing 0.33 units per milliliter was injected into the cervical stroma x5 cc. A Slimline hysteroscope with 12 lens was inserted into the canal and above findings were noted. Distention medium was normal saline. Distention method was a hysteroscopic pump set at 80 mm mercury. Above findings were noted. Using hysteroscopic scissors, the dense marginal adhesions were taken down.   Uterus sounded to 8 cm. Using Hanks dilators, the cervix was dilated to 29 [place]. Using a sharp curet, endometrium was gently curetted.  An 8 mm Cook intrauterine balloon stent was rolled around a tonsil clamp, and inserted into the endometrial cavity.  It was allowed to reform its shape by distending and with fluid and then the fluid was taken out.  The stem of the stent was sutured to the cervix with 2-0 silk. Hemostasis was insured. Instrument count was correct. Estimated blood loss was less than 20 mL. The patient tolerated the procedure well and was transferred to  recovery in satisfactory condition. She will take doxycycline 100 mg twice a day until the stent is removed in the office in 2 weeks.  She will take 0.1 mg of transdermal estradiol and estradiol 2 mg twice a day orally for 30 days.  Vaginal Prometrium 100 mg twice daily will be added to the last 5 days of this regimen.   Fermin SchwabYALCINKAYA,Heavyn Yearsley

## 2013-07-30 NOTE — Progress Notes (Signed)
History and Physical Interval Note:  07/30/2013 11:32 AM  Vanessa Durham  has presented today for surgery, with the diagnosis of ASHERMANS SYNDROME  The various methods of treatment have been discussed with the patient and family. After consideration of risks, benefits and other options for treatment, the patient has consented to  Procedure(s): HYSTEROSCOPY WITH LYSIS OF ADHESIONS (N/A) as a surgical intervention .  The patient's history has been reviewed, patient examined, no change in status, stable for surgery.  I have reviewed the patient's chart and labs.  Questions were answered to the patient's satisfaction.     Fermin SchwabYALCINKAYA,Gaynell Eggleton

## 2013-07-30 NOTE — H&P (Signed)
Vanessa Durham is a 38 y.o. 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 Recent HSG showed LUS marginal filling defects. Pt would like to proceed soon with PGS, in order to carry a euploid pregnancy. The only other pertinent finding in RPL w/u was PAI-1 4G/5G allele status. Pt denies any hx of VTE. Pertinent Gynecological History: Menses: flow is normalBleeding: dysfunctional uterine bleeding Contraception: none DES exposure: denies Blood transfusions: none Sexually transmitted diseases: no past history Previous GYN Procedures: none Last pap: normal  OB History: 9 SAB's/biochemical osses   Menstrual History: Menarche age: 33 No LMP recorded.    Past Medical History  Diagnosis Date  . Asherman's syndrome   . Hypothyroidism   . Mild acid reflux     watches diet                    Past Surgical History  Procedure Laterality Date  . Dilation and evacuation  11-20-2003  &  07-06-2006             History reviewed. No pertinent family history. No hereditary disease.  No cancer of breast, ovary, uterus. No RPL's.  History   Social History  . Marital Status: Married    Spouse Name: N/A    Number of Children: N/A  . Years of Education: N/A   Occupational History  . Not on file.   Social History Main Topics  . Smoking status: Never Smoker   . Smokeless tobacco: Never Used  . Alcohol Use: Yes     Comment: rare  . Drug Use: No  . Sexual Activity: Not on file   Other Topics Concern  . Not on file   Social History Narrative  . No narrative on file    Allergies  Allergen Reactions  . Penicillins Rash    No current facility-administered medications on file prior to encounter.   Current Outpatient Prescriptions on File Prior to Encounter  Medication Sig Dispense Refill  . Prenatal Vit-Fe Fumarate-FA (PRENATAL MULTIVITAMIN) TABS Take 1 tablet by mouth daily.         Review of Systems  Constitutional: Negative.   HENT:  Negative.   Eyes: Negative.   Respiratory: Negative.   Cardiovascular: Negative.   Gastrointestinal: Negative.   Genitourinary: Negative.   Musculoskeletal: Negative.   Skin: Negative.   Neurological: Negative.   Endo/Heme/Allergies: Negative.   Psychiatric/Behavioral: Negative.      Physical Exam  BP 131/81  Pulse 74  Temp(Src) 98.1 F (36.7 C) (Oral)  Resp 16  Ht 5' 6.5" (1.689 m)  Wt 82.555 kg (182 lb)  BMI 28.94 kg/m2  SpO2 98%  LMP 07/25/2013 Constitutional: She is oriented to person, place, and time. She appears well-developed and well-nourished.  HENT:  Head: Normocephalic and atraumatic.  Nose: Nose normal.  Mouth/Throat: Oropharynx is clear and moist. No oropharyngeal exudate.  Eyes: Conjunctivae normal and EOM are normal. Pupils are equal, round, and reactive to light. No scleral icterus.  Neck: Normal range of motion. Neck supple. No tracheal deviation present. No thyromegaly present.  Cardiovascular: Normal rate.   Respiratory: Effort normal and breath sounds normal.  GI: Soft. Bowel sounds are normal. She exhibits no distension and no mass. There is no tenderness.  Lymphadenopathy:    She has no cervical adenopathy.  Neurological: She is alert and oriented to person, place, and time. She has normal reflexes.  Skin: Skin is warm.  Psychiatric: She has a normal mood  and affect. Her behavior is normal. Judgment and thought content normal.       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     Fermin SchwabYALCINKAYA,Jeannetta Cerutti

## 2013-07-30 NOTE — Transfer of Care (Signed)
Immediate Anesthesia Transfer of Care Note  Patient: Vanessa Durham  Procedure(s) Performed: Procedure(s) (LRB): HYSTEROSCOPY WITH LYSIS OF ADHESIONS, D & C, Balloon placement  (N/A)  Patient Location: PACU  Anesthesia Type: General  Level of Consciousness: awake, sedated, patient cooperative and responds to stimulation  Airway & Oxygen Therapy: Patient Spontanous Breathing and Patient connected to face mask oxygen  Post-op Assessment: Report given to PACU RN, Post -op Vital signs reviewed and stable and Patient moving all extremities  Post vital signs: Reviewed and stable  Complications: No apparent anesthesia complications

## 2013-07-30 NOTE — Anesthesia Procedure Notes (Addendum)
Performed by: Jessica PriestBEESON, Vanessa Durham   Procedure Name: LMA Insertion Date/Time: 07/30/2013 12:11 PM Performed by: Jessica PriestBEESON, Alvon Nygaard Durham Pre-anesthesia Checklist: Patient identified, Emergency Drugs available, Suction available and Patient being monitored Patient Re-evaluated:Patient Re-evaluated prior to inductionOxygen Delivery Method: Circle System Utilized Preoxygenation: Pre-oxygenation with 100% oxygen Intubation Type: IV induction Ventilation: Mask ventilation without difficulty LMA: LMA inserted LMA Size: 4.0 Number of attempts: 1 Airway Equipment and Method: bite block Placement Confirmation: positive ETCO2 Tube secured with: Tape Dental Injury: Teeth and Oropharynx as per pre-operative assessment

## 2013-07-30 NOTE — Anesthesia Preprocedure Evaluation (Addendum)
Anesthesia Evaluation  Patient identified by MRN, date of birth, ID band Patient awake    Reviewed: Allergy & Precautions, H&P , NPO status , Patient's Chart, lab work & pertinent test results  Airway Mallampati: II TM Distance: >3 FB Neck ROM: Full    Dental  (+) Caps, Teeth Intact and Dental Advisory Given,    Pulmonary neg pulmonary ROS,  breath sounds clear to auscultation  Pulmonary exam normal       Cardiovascular negative cardio ROS  Rhythm:Regular Rate:Normal     Neuro/Psych negative neurological ROS  negative psych ROS   GI/Hepatic negative GI ROS, Neg liver ROS, GERD-  ,  Endo/Other  negative endocrine ROSHypothyroidism   Renal/GU negative Renal ROS  negative genitourinary   Musculoskeletal negative musculoskeletal ROS (+)   Abdominal   Peds  Hematology negative hematology ROS (+)   Anesthesia Other Findings   Reproductive/Obstetrics negative OB ROS                          Anesthesia Physical Anesthesia Plan  ASA: II  Anesthesia Plan: General   Post-op Pain Management:    Induction: Intravenous  Airway Management Planned: LMA  Additional Equipment:   Intra-op Plan:   Post-operative Plan: Extubation in OR  Informed Consent: I have reviewed the patients History and Physical, chart, labs and discussed the procedure including the risks, benefits and alternatives for the proposed anesthesia with the patient or authorized representative who has indicated his/her understanding and acceptance.   Dental advisory given  Plan Discussed with: CRNA  Anesthesia Plan Comments:         Anesthesia Quick Evaluation

## 2013-07-31 ENCOUNTER — Encounter (HOSPITAL_BASED_OUTPATIENT_CLINIC_OR_DEPARTMENT_OTHER): Payer: Self-pay | Admitting: Obstetrics and Gynecology

## 2013-07-31 NOTE — Anesthesia Postprocedure Evaluation (Signed)
Anesthesia Post Note  Patient: Vanessa Durham  Procedure(s) Performed: Procedure(s) (LRB): HYSTEROSCOPY WITH LYSIS OF ADHESIONS, D & C, Balloon placement  (N/A)  Anesthesia type: General  Patient location: PACU  Post pain: Pain level controlled  Post assessment: Post-op Vital signs reviewed  Last Vitals:  Filed Vitals:   07/30/13 1445  BP: 123/78  Pulse: 48  Temp: 36.1 C  Resp: 14    Post vital signs: Reviewed  Level of consciousness: sedated  Complications: No apparent anesthesia complications

## 2013-08-20 ENCOUNTER — Telehealth: Payer: Self-pay | Admitting: Family Medicine

## 2013-08-20 NOTE — Telephone Encounter (Signed)
Needs rubella vacination  Please advise

## 2013-08-20 NOTE — Telephone Encounter (Signed)
Patient was advised to go to health department because we wouldn't carry a rubella vaccine here. Vanessa Durham, Vanessa Durham '

## 2013-10-21 ENCOUNTER — Encounter (HOSPITAL_COMMUNITY): Payer: Self-pay | Admitting: Emergency Medicine

## 2013-10-21 ENCOUNTER — Emergency Department (HOSPITAL_COMMUNITY)
Admission: EM | Admit: 2013-10-21 | Discharge: 2013-10-21 | Disposition: A | Discharge status: Home / Self Care | Payer: BC Managed Care – PPO | Source: Home / Self Care | Attending: Family Medicine | Admitting: Family Medicine

## 2013-10-21 DIAGNOSIS — J020 Streptococcal pharyngitis: Secondary | ICD-10-CM

## 2013-10-21 DIAGNOSIS — J069 Acute upper respiratory infection, unspecified: Secondary | ICD-10-CM

## 2013-10-21 DIAGNOSIS — J0300 Acute streptococcal tonsillitis, unspecified: Secondary | ICD-10-CM

## 2013-10-21 LAB — POCT RAPID STREP A: Streptococcus, Group A Screen (Direct): POSITIVE — AB

## 2013-10-21 MED ORDER — CEPHALEXIN 500 MG PO CAPS: 500.0000 mg | ORAL_CAPSULE | Freq: Four times a day (QID) | ORAL | Status: AC

## 2013-10-21 NOTE — ED Provider Notes (Signed)
CSN: [id number]     Arrival date & time 10/21/13  [id number] History   First MD Initiated Contact with Patient 10/21/13 0945     Chief Complaint  Patient presents with  . URI   (Consider location/radiation/quality/duration/timing/severity/associated sxs/prior Treatment) Patient is a 38 y.o. female presenting with URI. The history is provided by the patient. No language interpreter was used.  URI Presenting symptoms: congestion, cough and sore throat   Severity:  Moderate Onset quality:  Gradual Duration:  2 days Timing:  Constant Progression:  Worsening Chronicity:  Recurrent Relieved by:  Nothing Worsened by:  Nothing tried Ineffective treatments:  None tried Risk factors: no sick contacts     Past Medical History  Diagnosis Date  . Asherman's syndrome   . Hypothyroidism   . Mild acid reflux     watches diet   Past Surgical History  Procedure Laterality Date  . Dilation and evacuation  11-20-2003  &  07-06-2006  . Hysteroscopy N/A 07/30/2013    Procedure: HYSTEROSCOPY WITH LYSIS OF ADHESIONS, D & C, Balloon placement ;  Surgeon: Fermin Schwabamer Yalcinkaya, MD;  Location: [facility];  Service: Gynecology;  Laterality: N/A;   History reviewed. No pertinent family history. History  Substance Use Topics  . Smoking status: Never Smoker   . Smokeless tobacco: Never Used  . Alcohol Use: Yes     Comment: rare   OB History   Grav Para Term Preterm Abortions TAB SAB Ect Mult Living                 Review of Systems  HENT: Positive for congestion and sore throat.   Respiratory: Positive for cough.     Allergies  Penicillins  Home Medications   Prior to Admission medications   Medication Sig Start Date End Date Taking? Authorizing Provider  Coenzyme Q10 (COQ-10) 100 MG CAPS Take 1 capsule by mouth daily.   Yes Historical Provider, MD  doxycycline (VIBRAMYCIN) 50 MG capsule Take 2 capsules (100 mg total) by mouth 2 (two) times daily. 07/30/13  Yes Fermin Schwabamer Yalcinkaya, MD   folic acid (FOLVITE) 1 MG tablet Take 1 mg by mouth daily.   Yes Historical Provider, MD  levothyroxine (SYNTHROID, LEVOTHROID) 75 MCG tablet Take 75 mcg by mouth daily before breakfast.   Yes Historical Provider, MD  OVER THE COUNTER MEDICATION Take by mouth daily. Herbal compound powder  2 scoops daily in water   Yes Historical Provider, MD  Prenatal Vit-Fe Fumarate-FA (PRENATAL MULTIVITAMIN) TABS Take 1 tablet by mouth daily.   Yes Historical Provider, MD   BP 143/97  Pulse 68  Temp(Src) 99.1 F (37.3 C) (Oral)  Resp 16  SpO2 100%  LMP 09/29/2013 Physical Exam  Nursing note and vitals reviewed. HENT:  Head: Normocephalic.  Right Ear: External ear normal.  Left Ear: External ear normal.  Nose: Nose normal.  Mouth/Throat: Oropharynx is clear and moist.  pharanxy erythematous  Eyes: Conjunctivae are normal. Pupils are equal, round, and reactive to light.  Neck: Normal range of motion. Neck supple.  Cardiovascular: Normal rate.   Pulmonary/Chest: Effort normal and breath sounds normal.  Abdominal: Soft.  Musculoskeletal: She exhibits edema.  Neurological: She is alert.  Skin: Skin is warm.  Psychiatric: She has a normal mood and affect.    ED Course  Procedures (including critical care time) Labs Review Labs Reviewed  POCT RAPID STREP A ([facility] CARE ONLY) - Abnormal; Notable for the following:    Streptococcus, Group A Screen (  Direct) POSITIVE (*)    All other components within normal limits    Imaging Review No results found.  Strep positive MDM   1. Strep tonsillitis   2. URI (upper respiratory infection)    Keflex     Elson AreasLeslie K General Wearing, PA-C 10/21/13 1220

## 2013-10-21 NOTE — Discharge Instructions (Signed)
Upper Respiratory Infection, Adult An upper respiratory infection (URI) is also known as the common cold. It is often caused by a type of germ (virus). Colds are easily spread (contagious). You can pass it to others by kissing, coughing, sneezing, or drinking out of the same glass. Usually, you get better in 1 or 2 weeks.  HOME CARE   Only take medicine as told by your doctor.  Use a warm mist humidifier or breathe in steam from a hot shower.  Drink enough water and fluids to keep your pee (urine) clear or pale yellow.  Get plenty of rest.  Return to work when your temperature is back to normal or as told by your doctor. You may use a face mask and wash your hands to stop your cold from spreading. GET HELP RIGHT AWAY IF:   After the first few days, you feel you are getting worse.  You have questions about your medicine.  You have chills, shortness of breath, or brown or red spit (mucus).  You have yellow or brown snot (nasal discharge) or pain in the face, especially when you bend forward.  You have a fever, puffy (swollen) neck, pain when you swallow, or white spots in the back of your throat.  You have a bad headache, ear pain, sinus pain, or chest pain.  You have a high-pitched whistling sound when you breathe in and out (wheezing).  You have a lasting cough or cough up blood.  You have sore muscles or a stiff neck. MAKE SURE YOU:   Understand these instructions.  Will watch your condition.  Will get help right away if you are not doing well or get worse. Document Released: 11/29/2007 Document Revised: 09/04/2011 Document Reviewed: 10/17/2010 [facility] Patient Information 2014 [place], [place]. Strep Throat Strep throat is an infection of the throat caused by a bacteria named Streptococcus pyogenes. Your caregiver may call the infection streptococcal "tonsillitis" or "pharyngitis" depending on whether there are signs of inflammation in the tonsils or back of the throat.  Strep throat is most common in children aged 5 15 years during the cold months of the year, but it can occur in people of any age during any season. This infection is spread from person to person (contagious) through coughing, sneezing, or other close contact. SYMPTOMS   Fever or chills.  Painful, swollen, red tonsils or throat.  Pain or difficulty when swallowing.  White or yellow spots on the tonsils or throat.  Swollen, tender lymph nodes or "glands" of the neck or under the jaw.  Red rash all over the body (rare). DIAGNOSIS  Many different infections can cause the same symptoms. A test must be done to confirm the diagnosis so the right treatment can be given. A "rapid strep test" can help your caregiver make the diagnosis in a few minutes. If this test is not available, a light swab of the infected area can be used for a throat culture test. If a throat culture test is done, results are usually available in a day or two. TREATMENT  Strep throat is treated with antibiotic medicine. HOME CARE INSTRUCTIONS   Gargle with 1 tsp of salt in 1 cup of warm water, 3 4 times per day or as needed for comfort.  Family members who also have a sore throat or fever should be tested for strep throat and treated with antibiotics if they have the strep infection.  Make sure everyone in your household washes their hands well.  Do not  share food, drinking cups, or personal items that could cause the infection to spread to others.  You may need to eat a soft food diet until your sore throat gets better.  Drink enough water and fluids to keep your urine clear or pale yellow. This will help prevent dehydration.  Get plenty of rest.  Stay home from school, daycare, or work until you have been on antibiotics for 24 hours.  Only take over-the-counter or prescription medicines for pain, discomfort, or fever as directed by your caregiver.  If antibiotics are prescribed, take them as directed. Finish  them even if you start to feel better. SEEK MEDICAL CARE IF:   The glands in your neck continue to enlarge.  You develop a rash, cough, or earache.  You cough up green, yellow-brown, or bloody sputum.  You have pain or discomfort not controlled by medicines.  Your problems seem to be getting worse rather than better. SEEK IMMEDIATE MEDICAL CARE IF:   You develop any new symptoms such as vomiting, severe headache, stiff or painful neck, chest pain, shortness of breath, or trouble swallowing.  You develop severe throat pain, drooling, or changes in your voice.  You develop swelling of the neck, or the skin on the neck becomes red and tender.  You have a fever.  You develop signs of dehydration, such as fatigue, dry mouth, and decreased urination.  You become increasingly sleepy, or you cannot wake up completely. Document Released: 06/09/2000 Document Revised: 05/29/2012 Document Reviewed: 08/11/2010 [facility] Patient Information 2014 [place], [place].

## 2013-10-21 NOTE — ED Notes (Signed)
C/o  Symptoms started with a sore throat on Friday and gradually gotten worse.  Decrease in appetite.  Denies fever but at times has felt warm.  Denies vomiting and diarrhea.  No otc meds taken.

## 2013-10-22 NOTE — ED Provider Notes (Signed)
Medical screening examination/treatment/procedure(s) were performed by resident physician or non-physician practitioner and as supervising physician I was immediately available for consultation/collaboration.   Kenlie Seki DOUGLAS MD.   Regan Llorente D Yamil Dougher, MD 10/22/13 1706 

## 2015-10-06 ENCOUNTER — Encounter: Payer: Self-pay | Admitting: Allergy and Immunology

## 2015-10-06 ENCOUNTER — Ambulatory Visit (INDEPENDENT_AMBULATORY_CARE_PROVIDER_SITE_OTHER): Payer: BLUE CROSS/BLUE SHIELD | Admitting: Allergy and Immunology

## 2015-10-06 VITALS — BP 112/80 | HR 72 | Temp 98.4°F | Resp 16 | Ht 65.75 in | Wt 169.5 lb

## 2015-10-06 DIAGNOSIS — J3089 Other allergic rhinitis: Secondary | ICD-10-CM

## 2015-10-06 DIAGNOSIS — L309 Dermatitis, unspecified: Secondary | ICD-10-CM | POA: Diagnosis not present

## 2015-10-06 HISTORY — DX: Dermatitis, unspecified: L30.9

## 2015-10-06 NOTE — Assessment & Plan Note (Addendum)
Unclear etiology.  It is possible that this rash was due to increased emotional stress and/or an immune response from a virus even if other manifestations of the virus were not clearly manifested.  Should significant symptoms or new symptoms occur, a journal is to be kept recording any foods eaten, beverages consumed, medications taken, activities performed, and environmental conditions within a 6 hour time period prior to the onset of symptoms. For any symptoms concerning for anaphylaxis, 911 is to be called immediately.   If symptoms persist or progress, we will proceed with patch testing to rule out allergic contact dermatitis.

## 2015-10-06 NOTE — Progress Notes (Signed)
New Patient Note  RE: Vanessa Durham MRN: [id number] DOB: 09/02/1975 Date of Office Visit: 10/06/2015  Referring provider: No ref. provider found Primary care provider: No PCP Per Patient  Chief Complaint: Rash   History of present illness: HPI Comments: Vanessa Durham is a 40 y.o. female presenting for evaluation of possible allergic reaction.  She reports that [place] 1 [place] ago [place] sunscreen on her body and later that day developed "small red itchy bumps" on the sides of her neck, upper chest, thighs, and groin area.  She applied topical diphenhydramine and the rash resolved within a few days.  Last week while out of town at a hotel she woke up with a rash with similar characteristics however this time it was on her abdomen, flanks, thighs, and arms.  Again, the rash resolved within a few days of applying topical diphenhydramine, topical hydrocortisone, and taking oral diphenhydramine.  She had not applied sunscreen or any other new cosmetics, nor had she used any new toiletries or detergents.  She did not experience concomitant cardiopulmonary or GI symptoms on either occasion.  She has taken Synthroid at a stable dose for many years out problems.  The only food that was common to the onset of rash on both occasions was Muscle Milk protein drink.  She has been experiencing significantly increased emotional stress over the past couple months.  She did not have overt signs or symptoms of a viral infection at the onset of the rash.  She has a history of allergic rhinitis which she controls with allergen avoidance measures and non-prescription remedies, however she has not used the herbs from almost a year.   Assessment and plan: Dermatitis Unclear etiology.  It is possible that this rash was due to increased emotional stress and/or an immune response from a virus even if other manifestations of the virus were not clearly manifested.  Should significant symptoms or  new symptoms occur, a journal is to be kept recording any foods eaten, beverages consumed, medications taken, activities performed, and environmental conditions within a 6 hour time period prior to the onset of symptoms. For any symptoms concerning for anaphylaxis, 911 is to be called immediately.   If symptoms persist or progress, we will proceed with patch testing to rule out allergic contact dermatitis.  Allergic rhinitis Mild, well-controlled.  Continue appropriate allergen avoidance measures and over-the-counter antihistamines.  If needed, we will prescribe nasal steroid spray.   Diagnositics: Food allergen skin testing: Negative despite a positive histamine control.    Physical examination: Blood pressure 112/80, pulse 72, temperature 98.4 F (36.9 C), temperature source Oral, resp. rate 16, height 5' 5.75" (1.67 m), weight 169 lb 8.5 oz (76.9 kg).  General: Alert, interactive, in no acute distress. HEENT: TMs pearly gray, turbinates mildly edematous without discharge, post-pharynx mildly erythematous. Neck: Supple without lymphadenopathy. Lungs: Clear to auscultation without wheezing, rhonchi or rales. CV: Normal S1, S2 without murmurs. Abdomen: Nondistended, nontender. Skin: Warm and dry, without lesions or rashes. Extremities:  No clubbing, cyanosis or edema. Neuro:   Grossly intact.  Review of systems:  Review of Systems  Constitutional: Negative for fever, chills and weight loss.  HENT: Positive for congestion. Negative for nosebleeds.   Eyes: Negative for blurred vision.  Respiratory: Negative for cough, hemoptysis, shortness of breath and wheezing.   Cardiovascular: Negative for chest pain.  Gastrointestinal: Negative for diarrhea and constipation.  Genitourinary: Negative for dysuria.  Musculoskeletal: Negative for myalgias and joint pain.  Skin: Positive for itching and rash.  Neurological: Negative for dizziness.  Endo/Heme/Allergies: Positive for  environmental allergies. Does not bruise/bleed easily.    Past medical history:  Past Medical History  Diagnosis Date  . Asherman's syndrome   . Hypothyroidism   . Mild acid reflux     watches diet  . Dermatitis 10/06/2015    Past surgical history:  Past Surgical History  Procedure Laterality Date  . Dilation and evacuation  11-20-2003  &  07-06-2006  . Hysteroscopy N/A 07/30/2013    Procedure: HYSTEROSCOPY WITH LYSIS OF ADHESIONS, D & C, Balloon placement ;  Surgeon: Fermin Schwab, MD;  Location: [facility];  Service: Gynecology;  Laterality: N/A;    Family history: Family History  Problem Relation Age of Onset  . Lupus Mother   . Lupus Maternal Grandmother   . Allergic rhinitis Neg Hx   . Angioedema Neg Hx   . Asthma Neg Hx   . Eczema Neg Hx   . Immunodeficiency Neg Hx   . Urticaria Neg Hx     Social history: Social History   Social History  . Marital Status: Married    Spouse Name: N/A  . Number of Children: N/A  . Years of Education: N/A   Occupational History  . Not on file.   Social History Main Topics  . Smoking status: Never Smoker   . Smokeless tobacco: Never Used  . Alcohol Use: Yes     Comment: rare  . Drug Use: No  . Sexual Activity: Yes   Other Topics Concern  . Not on file   Social History Narrative   Environmental History: The patient lives in an 42 year old house with carpeting in the bedroom and central air/heat.  She is a nonsmoker without pets.    Medication List       This list is accurate as of: 10/06/15  1:28 PM.  Always use your most recent med list.               cephALEXin 500 MG capsule  Commonly known as:  KEFLEX  Take 1 capsule (500 mg total) by mouth 4 (four) times daily.     CoQ-10 100 MG Caps  Take 1 capsule by mouth daily. Reported on 10/06/2015     doxycycline 50 MG capsule  Commonly known as:  VIBRAMYCIN  Take 2 capsules (100 mg total) by mouth 2 (two) times daily.     folic acid 1 MG  tablet  Commonly known as:  FOLVITE  Take 1 mg by mouth daily. Reported on 10/06/2015     levothyroxine 75 MCG tablet  Commonly known as:  SYNTHROID, LEVOTHROID  Take 75 mcg by mouth daily before breakfast.     OVER THE COUNTER MEDICATION  Take by mouth daily. Reported on 10/06/2015     prenatal multivitamin Tabs tablet  Take 1 tablet by mouth daily. Reported on 10/06/2015        Known medication allergies: Allergies  Allergen Reactions  . Penicillins Rash    I appreciate the opportunity to take part in this Rianne's care. Please do not hesitate to contact me with questions.  Sincerely,   R. Jorene Guest, MD

## 2015-10-06 NOTE — Assessment & Plan Note (Signed)
Mild, well-controlled.  Continue appropriate allergen avoidance measures and over-the-counter antihistamines.  If needed, we will prescribe nasal steroid spray.

## 2015-10-06 NOTE — Patient Instructions (Addendum)
Dermatitis Unclear etiology.  It is possible that this rash was due to increased emotional stress and/or an immune response from a virus even if other manifestations of the virus were not clearly manifested.  Should significant symptoms or new symptoms occur, a journal is to be kept recording any foods eaten, beverages consumed, medications taken, activities performed, and environmental conditions within a 6 hour time period prior to the onset of symptoms. For any symptoms concerning for anaphylaxis, 911 is to be called immediately.   If symptoms persist or progress, we will proceed with patch testing to rule out allergic contact dermatitis.  Allergic rhinitis Mild, well-controlled.  Continue appropriate allergen avoidance measures and over-the-counter antihistamines.  If needed, we will prescribe nasal steroid spray.    Return if symptoms worsen or fail to improve.

## 2015-12-13 ENCOUNTER — Telehealth: Payer: Self-pay | Admitting: Allergy and Immunology

## 2015-12-13 NOTE — Telephone Encounter (Signed)
[phone], please call concerning bill

## 2015-12-13 NOTE — Telephone Encounter (Signed)
EXPLAINED THAT AFTER 3 YEARS, YOU ARE CONSIDERED A NEW PT AGAIN - SHE SAW DR Willa RoughHICKS IN 2013

## 2017-08-17 ENCOUNTER — Encounter: Payer: Self-pay | Admitting: Internal Medicine

## 2018-08-12 ENCOUNTER — Other Ambulatory Visit: Payer: Self-pay | Admitting: Dermatology

## 2018-08-20 ENCOUNTER — Encounter: Payer: Self-pay | Admitting: Sports Medicine

## 2018-08-20 ENCOUNTER — Ambulatory Visit: Payer: 59 | Admitting: Sports Medicine

## 2018-08-20 DIAGNOSIS — M21619 Bunion of unspecified foot: Secondary | ICD-10-CM | POA: Diagnosis not present

## 2018-08-20 DIAGNOSIS — R269 Unspecified abnormalities of gait and mobility: Secondary | ICD-10-CM

## 2018-08-20 NOTE — Progress Notes (Signed)
CC: increased bunion pain and foot pain  Patient first seen by me in 2000 Foot pain and early bunion formation In ~ 2004 placed into orthotics She was able to resume running Less arch and bunion pain  In past year pain returning in both feet Not able to run  Her orthotics now are ~ 55 and 45 years old and seem less   ROS No swelling over first MTP No PF type pain  PE Pleasant F in NAD BP (!) 135/92   Ht 5' 6.5" (1.689 m)   Wt 180 lb (81.6 kg)   BMI 28.62 kg/m   Bunion with significant valgus shift on RT Bunion with less valgus shift on left Still with moderate ROM at MTP 1 bilaterally No swelling No redness Standing gait is pronated bilaterally Loss of long arch bilaterally

## 2018-08-20 NOTE — Assessment & Plan Note (Signed)
For replacement of custom orthotics  Patient was fitted for a : standard, cushioned, semi-rigid orthotic. The orthotic was heated and afterward the patient stood on the orthotic blank positioned on the orthotic stand. The patient was positioned in subtalar neutral position and 10 degrees of ankle dorsiflexion in a weight bearing stance. After completion of molding, a stable base was applied to the orthotic blank. The blank was ground to a stable position for weight bearing. Size:9 red EVA mod. density Base: Blue medium EVA Posting:RT first Ray Additional orthotic padding: None  Gait was returned to neutral with orthotics placement in both walking and running

## 2018-08-20 NOTE — Assessment & Plan Note (Signed)
Pain well controlled in custom orthotics until they were breaking down with wear  See result of new orthotics over next month

## 2019-01-31 ENCOUNTER — Other Ambulatory Visit: Payer: Self-pay | Admitting: Obstetrics and Gynecology

## 2019-01-31 DIAGNOSIS — IMO0002 Reserved for concepts with insufficient information to code with codable children: Secondary | ICD-10-CM

## 2019-02-07 ENCOUNTER — Other Ambulatory Visit: Payer: Self-pay | Admitting: Obstetrics and Gynecology

## 2019-02-10 ENCOUNTER — Ambulatory Visit
Admission: RE | Admit: 2019-02-10 | Discharge: 2019-02-10 | Disposition: A | Discharge status: Home / Self Care | Payer: 59 | Source: Ambulatory Visit | Attending: Obstetrics and Gynecology | Admitting: Obstetrics and Gynecology

## 2019-02-10 ENCOUNTER — Other Ambulatory Visit: Payer: Self-pay

## 2019-02-10 DIAGNOSIS — IMO0002 Reserved for concepts with insufficient information to code with codable children: Secondary | ICD-10-CM

## 2019-02-10 MED ORDER — IOPAMIDOL (ISOVUE-300) INJECTION 61%
100.0000 mL | Freq: Once | INTRAVENOUS | Status: AC | PRN
  Administered 2019-02-10: 100 mL via INTRAVENOUS

## 2019-03-04 ENCOUNTER — Other Ambulatory Visit: Payer: Self-pay | Admitting: Family Medicine

## 2019-03-04 DIAGNOSIS — R1909 Other intra-abdominal and pelvic swelling, mass and lump: Secondary | ICD-10-CM

## 2019-03-13 ENCOUNTER — Ambulatory Visit
Admission: RE | Admit: 2019-03-13 | Discharge: 2019-03-13 | Disposition: A | Discharge status: Home / Self Care | Payer: 59 | Source: Ambulatory Visit | Attending: Family Medicine | Admitting: Family Medicine

## 2019-03-13 DIAGNOSIS — R1909 Other intra-abdominal and pelvic swelling, mass and lump: Secondary | ICD-10-CM

## 2019-05-09 DIAGNOSIS — Z6826 Body mass index (BMI) 26.0-26.9, adult: Secondary | ICD-10-CM | POA: Diagnosis not present

## 2019-05-09 DIAGNOSIS — E782 Mixed hyperlipidemia: Secondary | ICD-10-CM | POA: Diagnosis not present

## 2019-06-05 DIAGNOSIS — Z03818 Encounter for observation for suspected exposure to other biological agents ruled out: Secondary | ICD-10-CM | POA: Diagnosis not present

## 2019-06-06 DIAGNOSIS — R790 Abnormal level of blood mineral: Secondary | ICD-10-CM | POA: Diagnosis not present

## 2019-06-06 DIAGNOSIS — Z6826 Body mass index (BMI) 26.0-26.9, adult: Secondary | ICD-10-CM | POA: Diagnosis not present

## 2019-07-11 DIAGNOSIS — Z6825 Body mass index (BMI) 25.0-25.9, adult: Secondary | ICD-10-CM | POA: Diagnosis not present

## 2019-07-11 DIAGNOSIS — E039 Hypothyroidism, unspecified: Secondary | ICD-10-CM | POA: Diagnosis not present

## 2019-09-01 DIAGNOSIS — E559 Vitamin D deficiency, unspecified: Secondary | ICD-10-CM | POA: Diagnosis not present

## 2019-09-01 DIAGNOSIS — E039 Hypothyroidism, unspecified: Secondary | ICD-10-CM | POA: Diagnosis not present

## 2019-10-06 DIAGNOSIS — M9903 Segmental and somatic dysfunction of lumbar region: Secondary | ICD-10-CM | POA: Diagnosis not present

## 2019-10-06 DIAGNOSIS — M9902 Segmental and somatic dysfunction of thoracic region: Secondary | ICD-10-CM | POA: Diagnosis not present

## 2019-10-06 DIAGNOSIS — M5441 Lumbago with sciatica, right side: Secondary | ICD-10-CM | POA: Diagnosis not present

## 2019-10-06 DIAGNOSIS — M9905 Segmental and somatic dysfunction of pelvic region: Secondary | ICD-10-CM | POA: Diagnosis not present

## 2019-10-08 DIAGNOSIS — M9903 Segmental and somatic dysfunction of lumbar region: Secondary | ICD-10-CM | POA: Diagnosis not present

## 2019-10-08 DIAGNOSIS — M9905 Segmental and somatic dysfunction of pelvic region: Secondary | ICD-10-CM | POA: Diagnosis not present

## 2019-10-08 DIAGNOSIS — M5441 Lumbago with sciatica, right side: Secondary | ICD-10-CM | POA: Diagnosis not present

## 2019-10-08 DIAGNOSIS — M9902 Segmental and somatic dysfunction of thoracic region: Secondary | ICD-10-CM | POA: Diagnosis not present

## 2019-10-10 DIAGNOSIS — M5441 Lumbago with sciatica, right side: Secondary | ICD-10-CM | POA: Diagnosis not present

## 2019-10-10 DIAGNOSIS — M9902 Segmental and somatic dysfunction of thoracic region: Secondary | ICD-10-CM | POA: Diagnosis not present

## 2019-10-10 DIAGNOSIS — M9903 Segmental and somatic dysfunction of lumbar region: Secondary | ICD-10-CM | POA: Diagnosis not present

## 2019-10-10 DIAGNOSIS — M9905 Segmental and somatic dysfunction of pelvic region: Secondary | ICD-10-CM | POA: Diagnosis not present

## 2019-10-20 DIAGNOSIS — M9905 Segmental and somatic dysfunction of pelvic region: Secondary | ICD-10-CM | POA: Diagnosis not present

## 2019-10-20 DIAGNOSIS — M9903 Segmental and somatic dysfunction of lumbar region: Secondary | ICD-10-CM | POA: Diagnosis not present

## 2019-10-20 DIAGNOSIS — M9902 Segmental and somatic dysfunction of thoracic region: Secondary | ICD-10-CM | POA: Diagnosis not present

## 2019-10-20 DIAGNOSIS — M5441 Lumbago with sciatica, right side: Secondary | ICD-10-CM | POA: Diagnosis not present

## 2019-10-22 DIAGNOSIS — M9902 Segmental and somatic dysfunction of thoracic region: Secondary | ICD-10-CM | POA: Diagnosis not present

## 2019-10-22 DIAGNOSIS — M9903 Segmental and somatic dysfunction of lumbar region: Secondary | ICD-10-CM | POA: Diagnosis not present

## 2019-10-22 DIAGNOSIS — M9905 Segmental and somatic dysfunction of pelvic region: Secondary | ICD-10-CM | POA: Diagnosis not present

## 2019-10-22 DIAGNOSIS — M5441 Lumbago with sciatica, right side: Secondary | ICD-10-CM | POA: Diagnosis not present

## 2019-10-23 DIAGNOSIS — M5441 Lumbago with sciatica, right side: Secondary | ICD-10-CM | POA: Diagnosis not present

## 2019-10-23 DIAGNOSIS — M9902 Segmental and somatic dysfunction of thoracic region: Secondary | ICD-10-CM | POA: Diagnosis not present

## 2019-10-23 DIAGNOSIS — M9905 Segmental and somatic dysfunction of pelvic region: Secondary | ICD-10-CM | POA: Diagnosis not present

## 2019-10-23 DIAGNOSIS — M9903 Segmental and somatic dysfunction of lumbar region: Secondary | ICD-10-CM | POA: Diagnosis not present

## 2019-10-28 DIAGNOSIS — M9905 Segmental and somatic dysfunction of pelvic region: Secondary | ICD-10-CM | POA: Diagnosis not present

## 2019-10-28 DIAGNOSIS — M9903 Segmental and somatic dysfunction of lumbar region: Secondary | ICD-10-CM | POA: Diagnosis not present

## 2019-10-28 DIAGNOSIS — M9902 Segmental and somatic dysfunction of thoracic region: Secondary | ICD-10-CM | POA: Diagnosis not present

## 2019-10-28 DIAGNOSIS — M5441 Lumbago with sciatica, right side: Secondary | ICD-10-CM | POA: Diagnosis not present

## 2019-10-31 DIAGNOSIS — M9903 Segmental and somatic dysfunction of lumbar region: Secondary | ICD-10-CM | POA: Diagnosis not present

## 2019-10-31 DIAGNOSIS — M9905 Segmental and somatic dysfunction of pelvic region: Secondary | ICD-10-CM | POA: Diagnosis not present

## 2019-10-31 DIAGNOSIS — M5441 Lumbago with sciatica, right side: Secondary | ICD-10-CM | POA: Diagnosis not present

## 2019-10-31 DIAGNOSIS — M9902 Segmental and somatic dysfunction of thoracic region: Secondary | ICD-10-CM | POA: Diagnosis not present

## 2019-11-03 DIAGNOSIS — M9903 Segmental and somatic dysfunction of lumbar region: Secondary | ICD-10-CM | POA: Diagnosis not present

## 2019-11-03 DIAGNOSIS — M9902 Segmental and somatic dysfunction of thoracic region: Secondary | ICD-10-CM | POA: Diagnosis not present

## 2019-11-03 DIAGNOSIS — M9905 Segmental and somatic dysfunction of pelvic region: Secondary | ICD-10-CM | POA: Diagnosis not present

## 2019-11-03 DIAGNOSIS — M5441 Lumbago with sciatica, right side: Secondary | ICD-10-CM | POA: Diagnosis not present

## 2019-11-04 DIAGNOSIS — Z1151 Encounter for screening for human papillomavirus (HPV): Secondary | ICD-10-CM | POA: Diagnosis not present

## 2019-11-04 DIAGNOSIS — Z6828 Body mass index (BMI) 28.0-28.9, adult: Secondary | ICD-10-CM | POA: Diagnosis not present

## 2019-11-04 DIAGNOSIS — Z01419 Encounter for gynecological examination (general) (routine) without abnormal findings: Secondary | ICD-10-CM | POA: Diagnosis not present

## 2019-11-07 DIAGNOSIS — M9905 Segmental and somatic dysfunction of pelvic region: Secondary | ICD-10-CM | POA: Diagnosis not present

## 2019-11-07 DIAGNOSIS — M9903 Segmental and somatic dysfunction of lumbar region: Secondary | ICD-10-CM | POA: Diagnosis not present

## 2019-11-07 DIAGNOSIS — M5441 Lumbago with sciatica, right side: Secondary | ICD-10-CM | POA: Diagnosis not present

## 2019-11-07 DIAGNOSIS — M9902 Segmental and somatic dysfunction of thoracic region: Secondary | ICD-10-CM | POA: Diagnosis not present

## 2019-11-10 DIAGNOSIS — M9902 Segmental and somatic dysfunction of thoracic region: Secondary | ICD-10-CM | POA: Diagnosis not present

## 2019-11-10 DIAGNOSIS — M5441 Lumbago with sciatica, right side: Secondary | ICD-10-CM | POA: Diagnosis not present

## 2019-11-10 DIAGNOSIS — M9905 Segmental and somatic dysfunction of pelvic region: Secondary | ICD-10-CM | POA: Diagnosis not present

## 2019-11-10 DIAGNOSIS — M9903 Segmental and somatic dysfunction of lumbar region: Secondary | ICD-10-CM | POA: Diagnosis not present

## 2019-11-13 DIAGNOSIS — M9903 Segmental and somatic dysfunction of lumbar region: Secondary | ICD-10-CM | POA: Diagnosis not present

## 2019-11-13 DIAGNOSIS — M9902 Segmental and somatic dysfunction of thoracic region: Secondary | ICD-10-CM | POA: Diagnosis not present

## 2019-11-13 DIAGNOSIS — M9905 Segmental and somatic dysfunction of pelvic region: Secondary | ICD-10-CM | POA: Diagnosis not present

## 2019-11-13 DIAGNOSIS — M5441 Lumbago with sciatica, right side: Secondary | ICD-10-CM | POA: Diagnosis not present

## 2020-12-23 IMAGING — US US PELVIS LIMITED
1 series · 14 of 14 positions shown · non-contrast
Comparison: CT pelvis dated February 10, 2019.

CLINICAL DATA: Right groin palpable abnormality for the past year.

EXAM:
ULTRASOUND OF RIGHT GROIN SOFT TISSUES
TECHNIQUE: Ultrasound examination of the groin soft tissues was performed in
the area of clinical concern.

[Series 1: us pelvis limited · 0.06mm/px · 14 acquisitions, 14 frames shown]
[im 1/14]
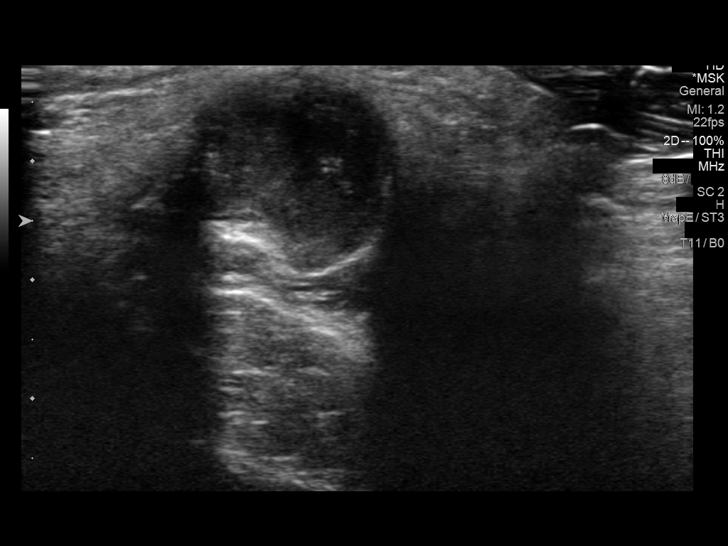
[im 2/14]
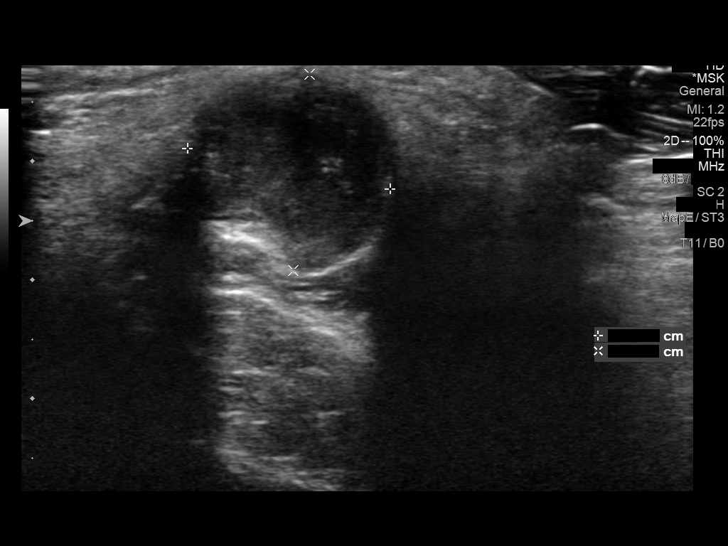
[im 3/14]
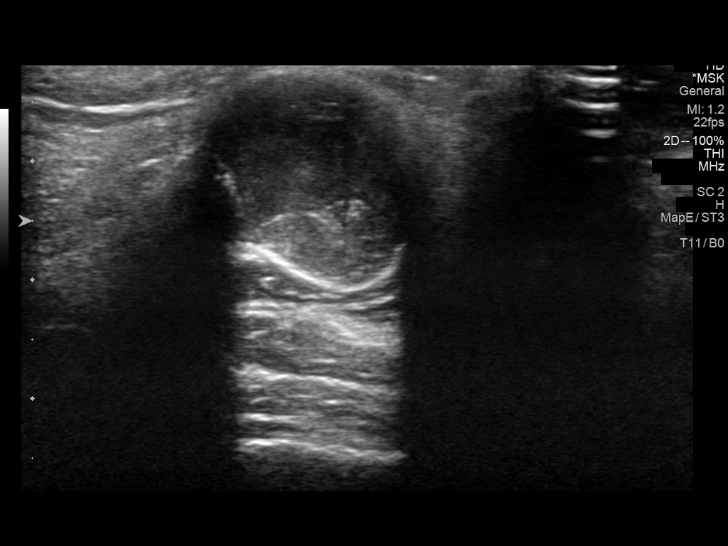
[im 4/14]
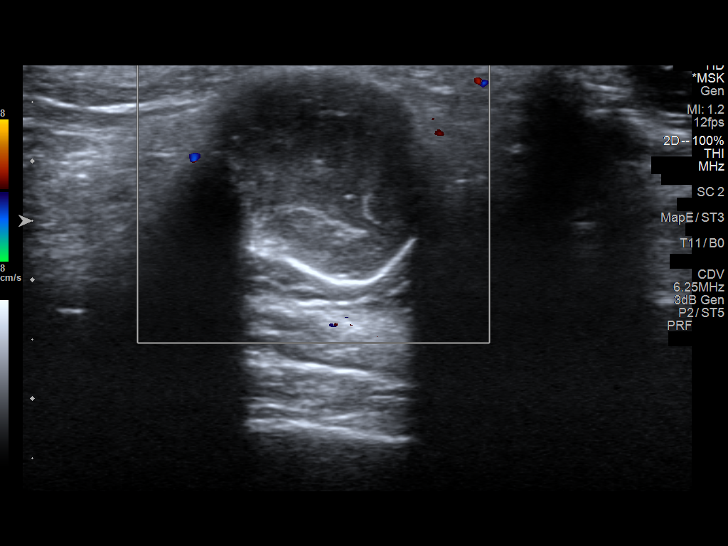
[im 5/14]
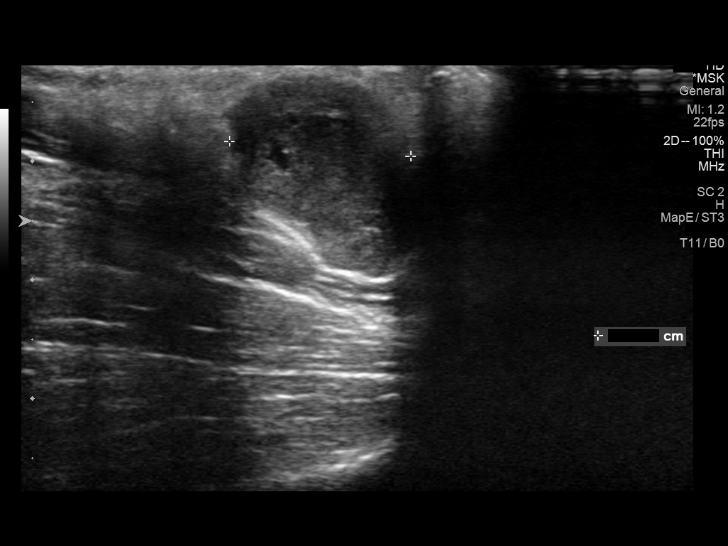
[im 6/14]
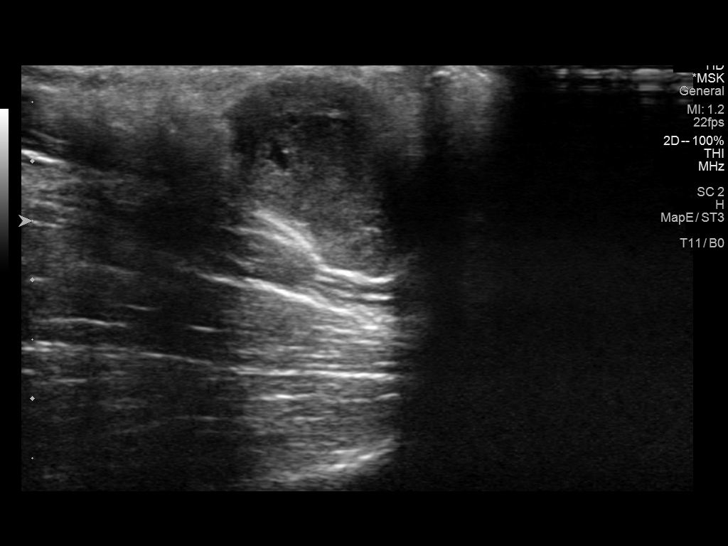
[im 7/14]
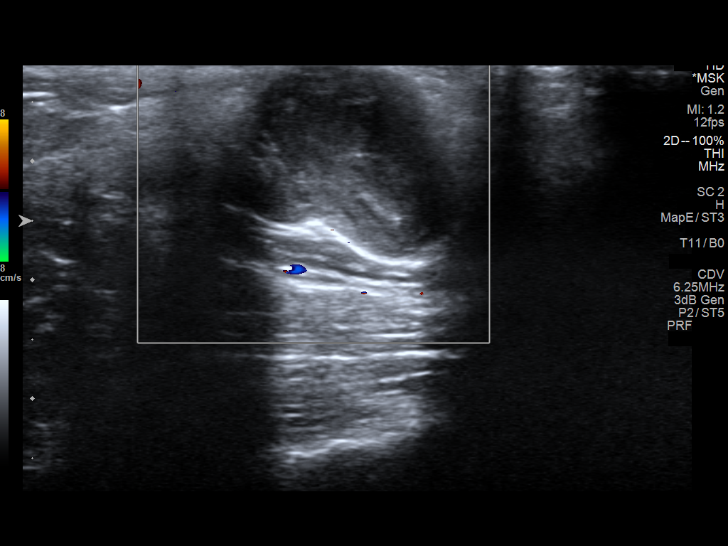
[im 8/14]
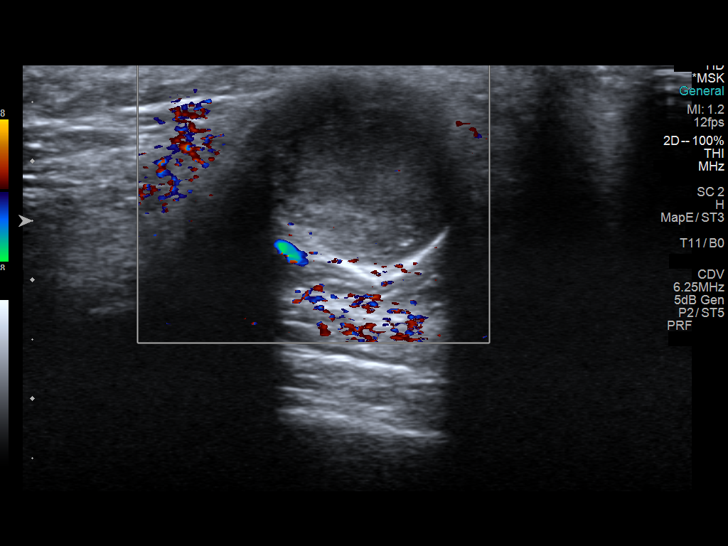
[im 9/14]
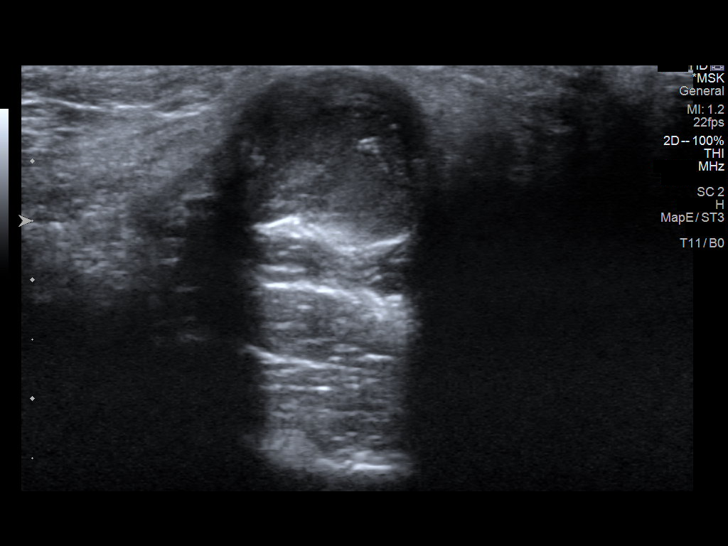
[im 10/14]
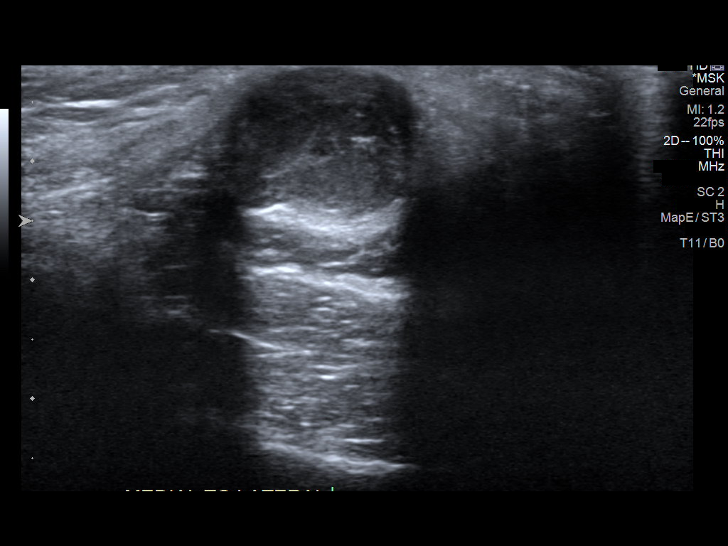
[im 11/14]
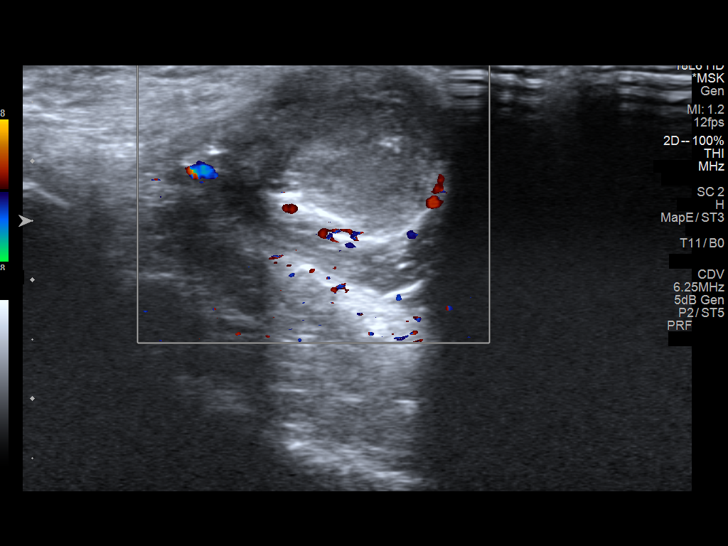
[im 12/14]
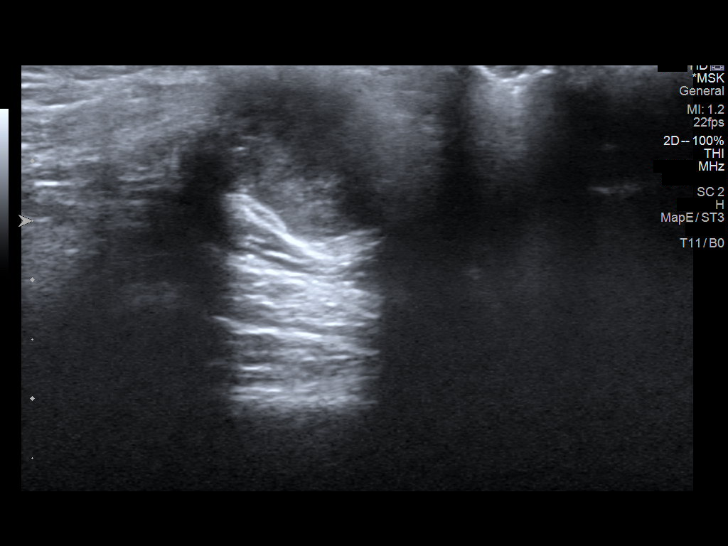
[im 13/14]
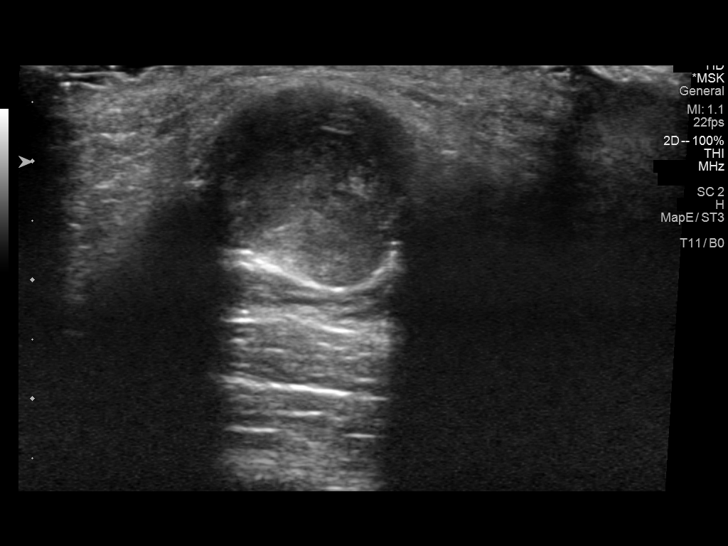
[im 14/14]
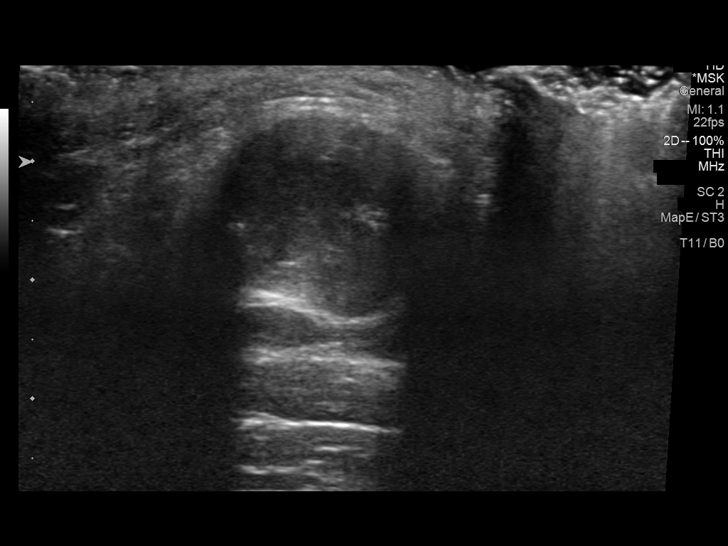

[14 of 14 positions shown; findings below may reference images not displayed]

FINDINGS: The palpable abnormality in the right groin corresponds to a
superficial 1.7 x 1.7 x 1.5 cm complex, heterogeneously hypoechoic
mass without internal vascularity.
IMPRESSION: 1. Palpable abnormality in the right groin corresponds to a
superficial 1.7 cm complex cystic lesion without internal
vascularity, likely an epidermoid cyst.

## 2021-08-04 DIAGNOSIS — N920 Excessive and frequent menstruation with regular cycle: Secondary | ICD-10-CM | POA: Diagnosis not present

## 2021-08-04 DIAGNOSIS — E039 Hypothyroidism, unspecified: Secondary | ICD-10-CM | POA: Diagnosis not present

## 2021-08-04 DIAGNOSIS — Z01419 Encounter for gynecological examination (general) (routine) without abnormal findings: Secondary | ICD-10-CM | POA: Diagnosis not present
